# Patient Record
Sex: Female | Born: 1970 | Race: Black or African American | Hispanic: No | Marital: Single | Smoking: Never smoker
Health system: Southern US, Community
[De-identification: ages and names within clinical notes are randomized; demographics above are authoritative.]

## PROBLEM LIST (undated history)

## (undated) ENCOUNTER — Inpatient Hospital Stay (HOSPITAL_COMMUNITY): Payer: Self-pay | Admitting: Obstetrics & Gynecology

## (undated) DIAGNOSIS — I10 Essential (primary) hypertension: Secondary | ICD-10-CM

## (undated) DIAGNOSIS — R51 Headache: Secondary | ICD-10-CM

## (undated) DIAGNOSIS — F1010 Alcohol abuse, uncomplicated: Secondary | ICD-10-CM

## (undated) DIAGNOSIS — F329 Major depressive disorder, single episode, unspecified: Secondary | ICD-10-CM

## (undated) DIAGNOSIS — S0240FA Zygomatic fracture, left side, initial encounter for closed fracture: Secondary | ICD-10-CM

## (undated) DIAGNOSIS — K219 Gastro-esophageal reflux disease without esophagitis: Secondary | ICD-10-CM

## (undated) DIAGNOSIS — Z9851 Tubal ligation status: Secondary | ICD-10-CM

## (undated) DIAGNOSIS — Z9049 Acquired absence of other specified parts of digestive tract: Secondary | ICD-10-CM

## (undated) DIAGNOSIS — R569 Unspecified convulsions: Secondary | ICD-10-CM

## (undated) DIAGNOSIS — R519 Headache, unspecified: Secondary | ICD-10-CM

## (undated) DIAGNOSIS — F32A Depression, unspecified: Secondary | ICD-10-CM

## (undated) DIAGNOSIS — N83209 Unspecified ovarian cyst, unspecified side: Secondary | ICD-10-CM

## (undated) DIAGNOSIS — M1990 Unspecified osteoarthritis, unspecified site: Secondary | ICD-10-CM

## (undated) HISTORY — PX: TUBAL LIGATION: SHX77

## (undated) HISTORY — PX: APPENDECTOMY: SHX54

## (undated) HISTORY — DX: Unspecified ovarian cyst, unspecified side: N83.209

---

## 2001-05-16 ENCOUNTER — Emergency Department (HOSPITAL_COMMUNITY): Admission: EM | Admit: 2001-05-16 | Discharge: 2001-05-16 | Payer: Self-pay | Admitting: Emergency Medicine

## 2002-02-17 ENCOUNTER — Emergency Department (HOSPITAL_COMMUNITY): Admission: EM | Admit: 2002-02-17 | Discharge: 2002-02-17 | Payer: Self-pay | Admitting: Emergency Medicine

## 2002-05-24 ENCOUNTER — Inpatient Hospital Stay (HOSPITAL_COMMUNITY): Admission: AD | Admit: 2002-05-24 | Discharge: 2002-05-24 | Payer: Self-pay | Admitting: Obstetrics and Gynecology

## 2002-09-21 ENCOUNTER — Emergency Department (HOSPITAL_COMMUNITY): Admission: EM | Admit: 2002-09-21 | Discharge: 2002-09-21 | Payer: Self-pay | Admitting: Emergency Medicine

## 2002-11-28 ENCOUNTER — Emergency Department (HOSPITAL_COMMUNITY): Admission: EM | Admit: 2002-11-28 | Discharge: 2002-11-28 | Payer: Self-pay | Admitting: Emergency Medicine

## 2002-12-30 ENCOUNTER — Emergency Department (HOSPITAL_COMMUNITY): Admission: EM | Admit: 2002-12-30 | Discharge: 2002-12-30 | Payer: Self-pay | Admitting: Emergency Medicine

## 2002-12-31 ENCOUNTER — Inpatient Hospital Stay (HOSPITAL_COMMUNITY): Admission: AD | Admit: 2002-12-31 | Discharge: 2002-12-31 | Payer: Self-pay | Admitting: Family Medicine

## 2003-02-15 ENCOUNTER — Encounter: Admission: RE | Admit: 2003-02-15 | Discharge: 2003-02-15 | Payer: Self-pay | Admitting: Obstetrics and Gynecology

## 2003-02-24 ENCOUNTER — Inpatient Hospital Stay (HOSPITAL_COMMUNITY): Admission: EM | Admit: 2003-02-24 | Discharge: 2003-02-26 | Payer: Self-pay | Admitting: Emergency Medicine

## 2003-02-25 ENCOUNTER — Encounter (INDEPENDENT_AMBULATORY_CARE_PROVIDER_SITE_OTHER): Payer: Self-pay | Admitting: *Deleted

## 2003-03-06 ENCOUNTER — Emergency Department (HOSPITAL_COMMUNITY): Admission: EM | Admit: 2003-03-06 | Discharge: 2003-03-06 | Payer: Self-pay | Admitting: Emergency Medicine

## 2003-12-01 ENCOUNTER — Ambulatory Visit: Payer: Self-pay | Admitting: Family Medicine

## 2004-01-29 ENCOUNTER — Emergency Department (HOSPITAL_COMMUNITY): Admission: EM | Admit: 2004-01-29 | Discharge: 2004-01-29 | Payer: Self-pay | Admitting: Family Medicine

## 2004-08-03 ENCOUNTER — Inpatient Hospital Stay (HOSPITAL_COMMUNITY): Admission: AD | Admit: 2004-08-03 | Discharge: 2004-08-03 | Payer: Self-pay | Admitting: Obstetrics and Gynecology

## 2004-08-23 ENCOUNTER — Emergency Department (HOSPITAL_COMMUNITY): Admission: EM | Admit: 2004-08-23 | Discharge: 2004-08-23 | Payer: Self-pay | Admitting: Emergency Medicine

## 2005-11-20 ENCOUNTER — Inpatient Hospital Stay (HOSPITAL_COMMUNITY): Admission: AD | Admit: 2005-11-20 | Discharge: 2005-11-20 | Payer: Self-pay | Admitting: Obstetrics & Gynecology

## 2006-05-22 ENCOUNTER — Inpatient Hospital Stay (HOSPITAL_COMMUNITY): Admission: AD | Admit: 2006-05-22 | Discharge: 2006-05-23 | Payer: Self-pay | Admitting: Family Medicine

## 2007-10-13 ENCOUNTER — Emergency Department (HOSPITAL_COMMUNITY): Admission: EM | Admit: 2007-10-13 | Discharge: 2007-10-13 | Payer: Self-pay | Admitting: Emergency Medicine

## 2008-12-30 ENCOUNTER — Emergency Department (HOSPITAL_COMMUNITY): Admission: EM | Admit: 2008-12-30 | Discharge: 2008-12-30 | Payer: Self-pay | Admitting: Emergency Medicine

## 2009-06-05 ENCOUNTER — Inpatient Hospital Stay (HOSPITAL_COMMUNITY): Admission: AD | Admit: 2009-06-05 | Discharge: 2009-06-05 | Payer: Self-pay | Admitting: Family Medicine

## 2010-01-15 ENCOUNTER — Emergency Department (HOSPITAL_COMMUNITY)
Admission: EM | Admit: 2010-01-15 | Discharge: 2010-01-15 | Payer: Self-pay | Source: Home / Self Care | Admitting: Emergency Medicine

## 2010-03-22 ENCOUNTER — Other Ambulatory Visit (HOSPITAL_COMMUNITY)
Admission: RE | Admit: 2010-03-22 | Discharge: 2010-03-22 | Disposition: A | Discharge status: Home / Self Care | Payer: Self-pay | Source: Ambulatory Visit | Attending: Family Medicine | Admitting: Family Medicine

## 2010-03-22 ENCOUNTER — Other Ambulatory Visit: Payer: Self-pay | Admitting: Family Medicine

## 2010-03-22 DIAGNOSIS — Z1159 Encounter for screening for other viral diseases: Secondary | ICD-10-CM | POA: Insufficient documentation

## 2010-03-22 DIAGNOSIS — Z113 Encounter for screening for infections with a predominantly sexual mode of transmission: Secondary | ICD-10-CM | POA: Insufficient documentation

## 2010-03-22 DIAGNOSIS — Z124 Encounter for screening for malignant neoplasm of cervix: Secondary | ICD-10-CM | POA: Insufficient documentation

## 2010-04-23 LAB — RAPID STREP SCREEN (MED CTR MEBANE ONLY): Streptococcus, Group A Screen (Direct): NEGATIVE

## 2010-05-01 LAB — CBC
HCT: 35 % — ABNORMAL LOW (ref 36.0–46.0)
Hemoglobin: 11.7 g/dL — ABNORMAL LOW (ref 12.0–15.0)
MCHC: 33.4 g/dL (ref 30.0–36.0)
MCV: 88.1 fL (ref 78.0–100.0)
Platelets: 199 10*3/uL (ref 150–400)
RBC: 3.97 MIL/uL (ref 3.87–5.11)
RDW: 13.2 % (ref 11.5–15.5)
WBC: 5.6 10*3/uL (ref 4.0–10.5)

## 2010-05-01 LAB — URINALYSIS, ROUTINE W REFLEX MICROSCOPIC
Bilirubin Urine: NEGATIVE
Glucose, UA: NEGATIVE mg/dL
Hgb urine dipstick: NEGATIVE
Ketones, ur: NEGATIVE mg/dL
Nitrite: NEGATIVE
Protein, ur: NEGATIVE mg/dL
Specific Gravity, Urine: >1.03 — ABNORMAL HIGH (ref 1.005–1.030)
Urobilinogen, UA: 0.2 mg/dL (ref 0.0–1.0)
pH: 6 (ref 5.0–8.0)

## 2010-05-01 LAB — WET PREP, GENITAL
Trich, Wet Prep: NONE SEEN
Yeast Wet Prep HPF POC: NONE SEEN

## 2010-05-01 LAB — GC/CHLAMYDIA PROBE AMP, GENITAL
Chlamydia, DNA Probe: NEGATIVE
GC Probe Amp, Genital: NEGATIVE

## 2010-05-01 LAB — POCT PREGNANCY, URINE: Preg Test, Ur: NEGATIVE

## 2010-06-29 NOTE — Consult Note (Signed)
NAMEELIANY, Anita Perkins                        ACCOUNT NO.:  [id number]   MEDICAL RECORD NO.:  [id number]                   PATIENT TYPE:  INP   LOCATION:  6706                                 FACILITY:  [facility]   PHYSICIAN:  Gabrielle Dare. Janee Morn, M.D.             DATE OF BIRTH:  04/10/1970   DATE OF CONSULTATION:  02/25/2003  DATE OF DISCHARGE:                                   CONSULTATION   REASON FOR CONSULTATION:  Acute appendicitis.   HISTORY OF PRESENT ILLNESS:  The patient is a 40 year old African-American  female who had been previously healthy.  She was admitted yesterday by the  teaching service with generalized abdominal pain that has gradually  localized to the right lower quadrant.  Her workup subsequently included CT  scan of the abdomen and pelvis.  This returned consistent with acute  appendicitis.  The patient is complaining of worsening right lower quadrant  abdominal pain and inability to find a good position.  She also has some  mild nausea.   PAST MEDICAL HISTORY:  Negative.   PAST SURGICAL HISTORY:  Tubal ligation nine years ago.   SOCIAL HISTORY:  She does not smoke.  She drinks alcohol rarely.  She worked  as a Advertising copywriter for [employer].  She has four children and lives with  her sister.   FAMILY HISTORY:  Her mother is alive and well, but she has no knowledge of  her father's medical history.   REVIEW OF SYSTEMS:  GENERAL:  She feels tired and ill.  HEENT:  Negative.  CARDIOVASCULAR:  Negative.  RESPIRATORY:  No shortness of breath and no  cough.  GI:  She has nausea and vomiting associated with this pain.  GU:  Negative.  MUSCULOSKELETAL:  Negative.  The remainder of the Review of  Systems is negative.   PHYSICAL EXAMINATION:  VITAL SIGNS:  Temperature 98.2, blood pressure  135/89, pulse 82, respirations 18.  GENERAL:  She is awake, alert,and probably in mild distress.  HEENT:  Pupils equal and reactive. Sclerae nonicteric.  NECK:  Supple with  no masses.  LUNGS:  Clear to auscultation bilaterally.  HEART:  Regular rate and rhythm with no murmur.  PMI is palpable in the left  chest.  Distal pulses are 2+ and equal bilaterally.  ABDOMEN:  Soft. She has tenderness in the right lower quadrant with  guarding.  She also has a positive Rovsing's sign.  EXTREMITIES:  No edema.  SKIN:  Warm and dry with no rashes.   LABORATORY DATA:  Data reviewed includes white blood cell count of 14.9.   CT scan shows acute appendicitis.   IMPRESSION AND PLAN:  Acute appendicitis in otherwise healthy female.   PLAN:  Take her emergently to the operating room for laparoscopic  appendectomy.  Procedure, risks, and benefits were discussed in detail with  the patient, and she is agreeable to proceeding.  We will give her  intravenously antibiotics.                                               Gabrielle Dare Janee Morn, M.D.    BET/MEDQ  D:  02/25/2003  T:  02/25/2003  Job:  [id number]

## 2010-06-29 NOTE — Discharge Summary (Signed)
Anita, Perkins                        ACCOUNT NO.:  [id number]   MEDICAL RECORD NO.:  [id number]                   PATIENT TYPE:  INP   LOCATION:  6706                                 FACILITY:  [facility]   PHYSICIAN:  Adonis Housekeeper, M.D.                   DATE OF BIRTH:  Nov 25, 1970   DATE OF ADMISSION:  02/24/2003  DATE OF DISCHARGE:  02/26/2003                                 DISCHARGE SUMMARY   DISCHARGE DIAGNOSIS:  Acute appendicitis.   DISCHARGE MEDICATIONS:  Percocet 5/325 1 to  2 tablets q.6h. p.r.n. pain.   PROCEDURES:  1. A CT scan of the abdomen and pelvis performed February 24, 2003. Findings,     enlarged appendix with appendicolith at the base and periappendiceal     inflammatory changes with a small  amount of free fluid, consistent with     appendicitis.  2. Laparoscopic appendectomy performed on February 25, 2003, surgeon Gabrielle Dare.     Janee Morn, M.D. Preoperative and postoperative diagnosis  acute     appendicitis. The appendix was gangrenous but not performed.   CONSULTS:  Gabrielle Dare. Janee Morn, M.D., general surgery.   HISTORY OF PRESENT ILLNESS:  Please see admitting history and physical for  full details.   CHIEF COMPLAINT:  Severe nausea and vomiting.   HISTORY OF PRESENT ILLNESS:  Anita Perkins is  a 40 year old woman with no  significant past medical history, who presented to the emergency room with a  10 hour history of severe nausea and vomiting and abdominal pain. These  symptoms began abruptly around 1 a.m. Her last meal was at [employer]  around 10 p.m. She vomited multiple times this morning. The pain is focused  in the epigastric area and extends to  both sides. It feels better  when she  sits up. She had a normal bowel movement this morning. She denies diarrhea  and  fever. She feels hungry at the time of this examination. No known  sick  contacts. No dysuria. She  denies alcohol  use in the past week.   ALLERGIES:  PENICILLIN.   PAST MEDICAL  HISTORY:  She is status post tubal ligation 9 years ago.   MEDICATIONS:  None.   SOCIAL HISTORY:  She has never smoked, occasional alcohol, no cocaine or IV  drug use. She is separated. 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 She has 4 children, ages 55, 32, 34 and 4.   FAMILY HISTORY:  Her mother is in her 71s and healthy. Her father is  unknown. Children are healthy. She had a grandmother with diabetes and a  history of stomach  cancer and liver cancer in her grandparents.   REVIEW OF SYSTEMS:  Positive for weight gain recently secondary to decreased  exercise. Chest pain associated with vomiting. Other review of systems is  negative.   PHYSICAL EXAMINATION:  VITAL SIGNS:  Pulse 91, blood pressure 135/89,  temperature 98.2, respiratory rate 22.  GENERAL:  She is weak but in no acute distress.  HEENT:  Pupils are equal, round and reactive to light. Extraocular movements  intact. Oropharynx is clear.  NECK:  Supple without lymphadenopathy.  RESPIRATORY:  Clear to auscultation bilaterally.  CARDIOVASCULAR:  Regular rate and rhythm, no murmur, rub or gallop.  ABDOMEN:  Soft, belly tender  in the epigastric  area. No rebound or  guarding. Positive bowel sounds.  EXTREMITIES:  No edema.  GU:  External hemorrhoids. Normal tone. Guaiac is pending.  SKIN:  No rash.  LYMPH:  No lymphadenopathy.  NEUROLOGIC:  Grossly  intact.  PSYCH:  She is alert and oriented. She seems weak but  her affect is  appropriate.   LABORATORY DATA:  On admission, sodium 138, potassium 3.8, chloride 105, CO2  25, BUN 20, creatinine 1.0, glucose 120, calcium 9.5. White blood cell count  14.9, hemoglobin 14.1, platelets 215. ANC 13.4, MCV 88. Bilirubin 0.7,  alkaline phosphatase 44, SGOT 18, SGPT 13, protein 7.4, albumin 4.2. Lipase  23, amylase 150. Urinalysis negative for nitrites, negative for leukocytes.  Abdominal ultrasound negative with a normal pancreas. Fecal occult blood  testing is  negative. Urine pregnancy test is negative.   ASSESSMENT:  Abdominal pain, nausea and vomiting. At this time Anita Perkins  seems to likely be suffering from acute mild gastroenteritis such as [place]  virus. Food poisoning is felt to be possible, but this is impossible to  prove with  only a single case and no food source to  test. Her alcohol  intake may be greater than first admitted, but  with  the normal ultrasound,  lipase and hunger pancreatitis, it is relatively unlikely. Her pain is  severe  but  nonlocalizing. The suspicion of appendicitis is low, but we  will of course continue to follow this.   PLAN:  1. Admit to a regular bed.  2. Pain medication as needed and Phenergan.  3. Strict n.p.o. for now.   HOSPITAL COURSE:  PROBLEM #1, APPENDICITIS:  On admission Anita Perkins was  felt to be suffering  from viral gastroenteritis, however, during her stay,  her condition worsened and her abdominal examination  became more focal. On  the night  of February 24, 2003, she showed periumbilical pain with rebound  tenderness  in the right lower quadrant. Because of this she was sent to CT  for a scan of the abdomen and pelvis.   At this time the scan showed acute appendicitis, and she was taken to the  operating room by Dr. Violeta Gelinas for a laparoscopic appendectomy. Please  see his operative note for further details of the surgery. She tolerated  the procedure well and returned to the floor in good condition. Her pain,  nausea and vomiting were all improved  after the surgery. She continued to  improve. Her appetite returned and she was discharged to home.   She was prescribed Percocet  for pain management  and instructed to call for  an appointment with Dr. Janee Morn in 3 weeks at phone number [phone].  Discharge labs are pending.                                                Adonis Housekeeper, M.D.   TS/MEDQ  D:  02/26/2003  T:  02/26/2003  Job:  [id number]   cc:   Gabrielle Dare. Janee Morn,  M.D.  [facility]  [address] [place], [place] [address]  Fax: [phone]

## 2010-06-29 NOTE — Discharge Summary (Signed)
NAMELEISA, GAULT                        ACCOUNT NO.:  [id number]   MEDICAL RECORD NO.:  [id number]                   PATIENT TYPE:  INP   LOCATION:  6706                                 FACILITY:  [facility]   PHYSICIAN:  Adonis Housekeeper, M.D.                   DATE OF BIRTH:  1971-01-10   DATE OF ADMISSION:  02/24/2003  DATE OF DISCHARGE:  02/26/2003                                 DISCHARGE SUMMARY   ADDENDUM:   LABORATORY DATA:  White blood cell count 8.8, hemoglobin 11.4, hematocrit  34.1, platelet count 175.   FOLLOW UP:  Patient was instructed to follow up with Dr. Janee Morn.  She will  call the clinic for an appointment in 3 weeks.  She was also given the  number of the internal medicine resident outpatient clinic to follow up with  Dr. Adonis Housekeeper.                                                Adonis Housekeeper, M.D.    TS/MEDQ  D:  03/01/2003  T:  03/02/2003  Job:  [id number]

## 2010-06-29 NOTE — Group Therapy Note (Signed)
NAME:  Anita Perkins, Anita Perkins                        ACCOUNT NO.:  [id number]   MEDICAL RECORD NO.:  [id number]                   PATIENT TYPE:  OUT   LOCATION:  [facility]                           FACILITY:  [facility]   PHYSICIAN:  Dante Gang                     DATE OF BIRTH:  1970-08-22   DATE OF SERVICE:  02/15/2003                                    CLINIC NOTE   REASON FOR VISIT:  Routine GYN care and follow up from bacterial vaginosis.   SUBJECTIVE:  Anita Perkins is a 40 year old woman with no significant past  medical history who is seen for the first time in this hospital at the  emergency room one month ago for symptoms of irregular bleeding and vaginal  discharge.  At that time she was diagnosed with bacterial vaginosis and  given a course of Flagyl.  She says since then these symptoms have resolved  completely.  She was told to follow up here for routine GYN care including a  Pap smear.  She had her last Pap smear probably 2 years ago and was pretty  good about annual exams up until that point.  She says that she has never  had an abnormal Pap smear.   She has no other complaints at this time.   PAST MEDICAL HISTORY:  None of significance.   PAST GYNECOLOGICAL HISTORY:  She is a G 4, P 4 with four healthy children  ages 71, 49, 67, and 20.  There were no complications at any of these  pregnancies.   MEDICATIONS:  None currently.   REVIEW OF SYSTEMS:  No other significant complaints.   OBJECTIVE:  VITAL SIGNS:  Pulse is 88, blood pressure 147/76, weight is  171.8.  GENERAL:  This is a healthy-appearing 40 year old woman.  PELVIC:  Reveals normal external genitalia with no discharge.  Speculum exam  and bimanual exam reveal no abnormalities.  Of note, she does have  significant hemorrhoids.   ASSESSMENT AND PLAN:  1. Routine gynecological care.  Pap smear was performed today and she will     be informed of the results.  2. Status post bacterial vaginosis.  She will  return to clinic if the     symptoms return but for now she is doing well.  3. Hemorrhoids.  These are only of mild annoyance at this point and I     instructed her in treating them with over-the-counter creams.  If these     become more problematic, she should consider a surgical referral for     them.                                               Dante Gang    JP/MEDQ  D:  02/15/2003  T:  02/15/2003  Job:  [id number]

## 2010-06-29 NOTE — Op Note (Signed)
Anita Perkins, Anita Perkins                        ACCOUNT NO.:  [id number]   MEDICAL RECORD NO.:  [id number]                   PATIENT TYPE:  INP   LOCATION:  6706                                 FACILITY:  [facility]   PHYSICIAN:  Gabrielle Dare. Janee Morn, M.D.             DATE OF BIRTH:  1970/09/01   DATE OF PROCEDURE:  02/25/2003  DATE OF DISCHARGE:                                 OPERATIVE REPORT   PREOPERATIVE DIAGNOSIS:  Acute appendicitis.   POSTOPERATIVE DIAGNOSIS:  Acute appendicitis.   PROCEDURE:  Laparoscopic appendectomy.   SURGEON:  Gabrielle Dare. Janee Morn, M.D.   ANESTHESIA:  General.   HISTORY OF PRESENT ILLNESS:  The patient is a 40 year old African American  female  who was admitted with generalized abdominal pain yesterday to the  teaching service. The pain gradually  localized to her right lower quadrant.  She underwent  a CT scan of the abdomen and pelvis which was consistent with  acute appendicitis. She is brought to the operating room emergently for a  laparoscopic appendectomy.   DESCRIPTION OF PROCEDURE:  Informed consent was obtained. The patient  received intravenous antibiotics and she was brought to the operating room.  General anesthesia was administered. Her abdomen  was prepped and draped in  a sterile fashion.   A curvilinear infraumbilical incision was made. The subcutaneous tissues  were dissected down revealing the anterior fascia which was divided sharply.  The peritoneal cavity was then entered under direct vision without  difficulty. A 0 Vicryl pursestring suture  was placed around the fascial  opening and the Hasson trocar was inserted into the abdomen. The abdomen was  then inflated with CO2 in a standard fashion.   Under direct vision, a 12-mm left lower quadrant and a 5-mm right upper  quadrant port were placed. The abdomen was explored, revealing  a small  amount of cloudy fluid down in the pelvis. The appendix was clearly inflamed  and wrapped with a  bit of omentum. The omentum was dissected off bluntly.  The appendix appeared gangrenous in places but without frank perforation.  The base was nice and viable.   A window was made around the base. Once this was adequately dissected, the  base was divided with an endoscopic GIA stapler with a tissue load.  Subsequently the mesoappendix was taken down with 2 separate firings of the  endoscopic GIA with a vascular load.   This completed removal of the appendix. It was placed in an EndoCatch bag  and taken out of the abdomen via the left lower quadrant port site.  Subsequently the abdomen was copiously irrigated. The mesoappendix remained  hemostatic. The staple line on the cecum looked  nicely intact. We irrigated  the abdomen with 2 liters of saline until the irrigant returned completely  clear. There was no significant purulence. The area was checked again for  hemostasis.   The patient was flattened down. The remainder  of the irrigation was  suctioned out which was clear. The ports were removed under direct vision.  The pneumoperitoneum was released and the Hasson trocar was removed. The  umbilical fascial defect was closed by tying the 0 Vicryl pursestring  suture. All 3 wounds were copiously irrigated. Then 0.25% Marcaine with  epinephrine was used for postoperative pain control and the skin of each was  closed with a running 4-0 Vicryl subcuticular stitch.   All sponge, instrument and needle counts were correct. Benzoin and Steri-  Strips and sterile dressings were applied. The patient tolerated the  procedure without apparent complications and was taken to the recovery room  in stable condition.                                               Gabrielle Dare Janee Morn, M.D.    BET/MEDQ  D:  02/25/2003  T:  02/25/2003  Job:  [id number]

## 2010-11-14 LAB — RAPID STREP SCREEN (MED CTR MEBANE ONLY): Streptococcus, Group A Screen (Direct): NEGATIVE

## 2012-02-07 ENCOUNTER — Emergency Department (HOSPITAL_COMMUNITY): Payer: Self-pay

## 2012-02-07 ENCOUNTER — Observation Stay (HOSPITAL_COMMUNITY)
Admission: EM | Admit: 2012-02-07 | Discharge: 2012-02-08 | Disposition: A | Discharge status: Home / Self Care | Payer: Self-pay | Attending: Obstetrics & Gynecology | Admitting: Obstetrics & Gynecology

## 2012-02-07 ENCOUNTER — Encounter (HOSPITAL_COMMUNITY): Payer: Self-pay

## 2012-02-07 DIAGNOSIS — I10 Essential (primary) hypertension: Secondary | ICD-10-CM | POA: Insufficient documentation

## 2012-02-07 DIAGNOSIS — K661 Hemoperitoneum: Secondary | ICD-10-CM

## 2012-02-07 DIAGNOSIS — N83209 Unspecified ovarian cyst, unspecified side: Principal | ICD-10-CM | POA: Insufficient documentation

## 2012-02-07 DIAGNOSIS — N949 Unspecified condition associated with female genital organs and menstrual cycle: Secondary | ICD-10-CM | POA: Insufficient documentation

## 2012-02-07 DIAGNOSIS — D62 Acute posthemorrhagic anemia: Secondary | ICD-10-CM | POA: Insufficient documentation

## 2012-02-07 DIAGNOSIS — R112 Nausea with vomiting, unspecified: Secondary | ICD-10-CM | POA: Insufficient documentation

## 2012-02-07 DIAGNOSIS — R109 Unspecified abdominal pain: Secondary | ICD-10-CM | POA: Insufficient documentation

## 2012-02-07 HISTORY — DX: Tubal ligation status: Z98.51

## 2012-02-07 HISTORY — DX: Essential (primary) hypertension: I10

## 2012-02-07 HISTORY — DX: Acquired absence of other specified parts of digestive tract: Z90.49

## 2012-02-07 LAB — URINALYSIS, ROUTINE W REFLEX MICROSCOPIC
Bilirubin Urine: NEGATIVE
Glucose, UA: NEGATIVE mg/dL
Hgb urine dipstick: NEGATIVE
Ketones, ur: NEGATIVE mg/dL
Leukocytes, UA: NEGATIVE
Protein, ur: NEGATIVE mg/dL
Specific Gravity, Urine: 1.029 (ref 1.005–1.030)
Urobilinogen, UA: 0.2 mg/dL (ref 0.0–1.0)

## 2012-02-07 LAB — COMPREHENSIVE METABOLIC PANEL
ALT: 8 U/L (ref 0–35)
AST: 11 U/L (ref 0–37)
Albumin: 3.7 g/dL (ref 3.5–5.2)
BUN: 15 mg/dL (ref 6–23)
CO2: 23 mEq/L (ref 19–32)
Calcium: 9.3 mg/dL (ref 8.4–10.5)
Creatinine, Ser: 0.88 mg/dL (ref 0.50–1.10)
GFR calc Af Amer: >90 mL/min (ref >90)
GFR calc non Af Amer: 82 mL/min — ABNORMAL LOW (ref >90)
Glucose, Bld: 116 mg/dL — ABNORMAL HIGH (ref 70–99)
Potassium: 3.8 mEq/L (ref 3.5–5.1)
Total Protein: 6.8 g/dL (ref 6.0–8.3)

## 2012-02-07 LAB — CBC WITH DIFFERENTIAL/PLATELET
Basophils Relative: 0 % (ref 0–1)
Eosinophils Absolute: 0 10*3/uL (ref 0.0–0.7)
Eosinophils Relative: 0 % (ref 0–5)
Hemoglobin: 11 g/dL — ABNORMAL LOW (ref 12.0–15.0)
Lymphs Abs: 1.1 10*3/uL (ref 0.7–4.0)
MCH: 29.6 pg (ref 26.0–34.0)
MCHC: 33.7 g/dL (ref 30.0–36.0)
MCV: 87.6 fL (ref 78.0–100.0)
Monocytes Absolute: 0.4 10*3/uL (ref 0.1–1.0)
Monocytes Relative: 4 % (ref 3–12)
Platelets: 221 10*3/uL (ref 150–400)
RBC: 3.72 MIL/uL — ABNORMAL LOW (ref 3.87–5.11)
RDW: 12.9 % (ref 11.5–15.5)
WBC: 10.3 10*3/uL (ref 4.0–10.5)

## 2012-02-07 LAB — POCT PREGNANCY, URINE: Preg Test, Ur: NEGATIVE

## 2012-02-07 LAB — LIPASE, BLOOD: Lipase: 18 U/L (ref 11–59)

## 2012-02-07 LAB — WET PREP, GENITAL
Trich, Wet Prep: NONE SEEN
Yeast Wet Prep HPF POC: NONE SEEN

## 2012-02-07 MED ORDER — ONDANSETRON HCL 4 MG/2ML IJ SOLN
4.0000 mg | Freq: Once | INTRAMUSCULAR | Status: AC
  Administered 2012-02-07: 4 mg via INTRAVENOUS
  Filled 2012-02-07: qty 2

## 2012-02-07 MED ORDER — HYDROMORPHONE HCL PF 1 MG/ML IJ SOLN
1.0000 mg | Freq: Once | INTRAMUSCULAR | Status: AC
  Administered 2012-02-07: 1 mg via INTRAVENOUS
  Filled 2012-02-07: qty 1

## 2012-02-07 MED ORDER — HYDROMORPHONE HCL PF 1 MG/ML IJ SOLN
1.0000 mg | INTRAMUSCULAR | Status: DC | PRN
  Administered 2012-02-08: 1 mg via INTRAVENOUS
  Filled 2012-02-07 (×5): qty 1

## 2012-02-07 MED ORDER — MORPHINE SULFATE 4 MG/ML IJ SOLN
4.0000 mg | Freq: Once | INTRAMUSCULAR | Status: AC
  Administered 2012-02-07: 4 mg via INTRAVENOUS
  Filled 2012-02-07: qty 1

## 2012-02-07 MED ORDER — SODIUM CHLORIDE 0.9 % IV BOLUS (SEPSIS)
1000.0000 mL | Freq: Once | INTRAVENOUS | Status: AC
  Administered 2012-02-07: 1000 mL via INTRAVENOUS

## 2012-02-07 MED ORDER — IOHEXOL 300 MG/ML  SOLN
100.0000 mL | Freq: Once | INTRAMUSCULAR | Status: AC | PRN
  Administered 2012-02-07: 100 mL via INTRAVENOUS

## 2012-02-07 MED ORDER — SODIUM CHLORIDE 0.9 % IV SOLN
Freq: Once | INTRAVENOUS | Status: AC
  Administered 2012-02-07: 17:00:00 via INTRAVENOUS

## 2012-02-07 NOTE — ED Provider Notes (Signed)
History     CSN: [id number]  Arrival date & time 02/07/12  1352   First MD Initiated Contact with Patient 02/07/12 1505      Chief Complaint  Patient presents with  . Abdominal Pain  . Nausea  . Pelvic Pain    Patient is a 41 y.o. female presenting with abdominal pain. The history is provided by the patient.  Abdominal Pain The primary symptoms of the illness include nausea and diarrhea. The primary symptoms of the illness do not include fever, shortness of breath, vomiting, dysuria, vaginal discharge or vaginal bleeding. The current episode started 6 to 12 hours ago. The onset of the illness was sudden. The problem has been gradually worsening.  The patient has had a change in bowel habit. Symptoms associated with the illness do not include urgency, frequency or back pain.  pt reports she had otherwise been at her baseline health today and then had onset of lower pelvic pain that radiates into her upper abdomen.  She reports one episode of diarrhea.  No fever. No vomiting.  She has never had this before.   She reports pain is so severe it "takes my breath away" but no active CP/SOB reported Past Medical History  Diagnosis Date  . Hx of appendectomy   . H/O tubal ligation   . Hypertension     Past Surgical History  Procedure Date  . Appendectomy   . Tubal ligation     History reviewed. No pertinent family history.  History  Substance Use Topics  . Smoking status: Never Smoker   . Smokeless tobacco: Not on file  . Alcohol Use: Yes     Comment: 1-2 bottles wine/ every other day    OB History    Grav Para Term Preterm Abortions TAB SAB Ect Mult Living                  Review of Systems  Constitutional: Negative for fever.  Respiratory: Negative for shortness of breath.   Cardiovascular: Negative for chest pain.  Gastrointestinal: Positive for nausea and diarrhea. Negative for vomiting.  Genitourinary: Negative for dysuria, urgency, frequency, vaginal bleeding and  vaginal discharge.  Musculoskeletal: Negative for back pain.  Psychiatric/Behavioral: Negative for agitation.  All other systems reviewed and are negative.    Allergies  Penicillins  Home Medications  No current outpatient prescriptions on file.  BP 148/88  Pulse 72  Temp 98.6 F (37 C) (Oral)  Resp 18  SpO2 100%  LMP 12/20/2011  Physical Exam CONSTITUTIONAL: Well developed/well nourished HEAD AND FACE: Normocephalic/atraumatic EYES: EOMI/PERRL, no icterus ENMT: Mucous membranes moist NECK: supple no meningeal signs SPINE:entire spine nontender CV: S1/S2 noted, no murmurs/rubs/gallops noted LUNGS: Lungs are clear to auscultation bilaterally, no apparent distress ABDOMEN: soft, diffuse tenderness, tenderness is moderate, no rebound or guarding GU:no cva tenderness No cmt.  No uterine tenderness.  No vag bleeding/discharge.  No adnexal tenderness.  Chaperone present NEURO: Pt is awake/alert, moves all extremitiesx4 EXTREMITIES: pulses normal, full ROM SKIN: warm, color normal PSYCH: no abnormalities of mood noted  ED Course  Procedures CRITICAL CARE Performed by: Joya Gaskins   Total critical care time: 40  Critical care time was exclusive of separately billable procedures and treating other patients.  Critical care was necessary to treat or prevent imminent or life-threatening deterioration.  Critical care was time spent personally by me on the following activities: development of treatment plan with patient and/or surrogate as well as nursing, discussions with consultants, evaluation  of patient's response to treatment, examination of patient, obtaining history from patient or surrogate, ordering and performing treatments and interventions, ordering and review of laboratory studies, ordering and review of radiographic studies, pulse oximetry and re-evaluation of patient's condition.  Labs Reviewed  URINALYSIS, ROUTINE W REFLEX MICROSCOPIC - Abnormal; Notable  for the following:    APPearance CLOUDY (*)     All other components within normal limits  POCT PREGNANCY, URINE  CBC WITH DIFFERENTIAL  COMPREHENSIVE METABOLIC PANEL  LIPASE, BLOOD  GC/CHLAMYDIA PROBE AMP  WET PREP, GENITAL   3:26 PM Pt presents with acute onset of lower abd pain. She is tender on abdominal exam.  Will treat pain and reassess 4:50 PM Pt still with diffuse abdominal pain in all quadrants.  No significant uterine or adnexal tenderness.  Will obtain CT imaging of abdomen 8:01 PM D/w radiology concerning CT findings Will call gynecology 8:07 PM D/w dr legget with OBGYN [facility].  Will not need emergent operative repair   MDM  Nursing notes including past medical history and social history reviewed and considered in documentation Labs/vital reviewed and considered Previous records reviewed and considered - h/o appendicitis with appendectomy         Joya Gaskins, MD 02/07/12 2007

## 2012-02-07 NOTE — ED Notes (Signed)
Pt to CT at this time.

## 2012-02-07 NOTE — ED Notes (Signed)
Reports around 9:00am while @ work experienced abdomin pain rated 10/10 thought it may have been menstrual cramps (placed peripad on period is late which is normal for patient.) Left work an hour early due to the pain. While in triage had loose stool. Stated pain starts in stomach & goes to chest.

## 2012-02-07 NOTE — ED Notes (Addendum)
Starting this am, pt has been having lower abd pain/pelvic pain and nausea; "like something is going to fall out"   States her tubes are tied.  Denies urinary symptoms except frequency.  Pain increases w/ambulation and palpation.

## 2012-02-07 NOTE — ED Notes (Signed)
Per EMS:  Pt states that she is having lower abd pain that started around 0800 this morning.  Pt attributed this to menstrual cramping however she is "late for her period".  Pain 10/10.  Pt is not on birth control.  Last had sex first week of December.

## 2012-02-07 NOTE — ED Notes (Signed)
Oral contrast started.

## 2012-02-07 NOTE — ED Notes (Signed)
MD Wickline at bedside to update patient and family.

## 2012-02-07 NOTE — ED Notes (Signed)
[facility]

## 2012-02-07 NOTE — ED Notes (Signed)
Pt to be transferred to room 315 at [facility] hospital

## 2012-02-08 ENCOUNTER — Inpatient Hospital Stay (HOSPITAL_COMMUNITY): Payer: Self-pay

## 2012-02-08 DIAGNOSIS — D62 Acute posthemorrhagic anemia: Secondary | ICD-10-CM | POA: Diagnosis present

## 2012-02-08 DIAGNOSIS — I10 Essential (primary) hypertension: Secondary | ICD-10-CM | POA: Diagnosis present

## 2012-02-08 DIAGNOSIS — R109 Unspecified abdominal pain: Secondary | ICD-10-CM

## 2012-02-08 DIAGNOSIS — N83209 Unspecified ovarian cyst, unspecified side: Secondary | ICD-10-CM | POA: Diagnosis present

## 2012-02-08 LAB — CBC
HCT: 27.2 % — ABNORMAL LOW (ref 36.0–46.0)
MCH: 29.6 pg (ref 26.0–34.0)
MCHC: 32.8 g/dL (ref 30.0–36.0)
MCHC: 33.3 g/dL (ref 30.0–36.0)
MCHC: 33.5 g/dL (ref 30.0–36.0)
Platelets: 162 10*3/uL (ref 150–400)
Platelets: 185 10*3/uL (ref 150–400)
RBC: 2.94 MIL/uL — ABNORMAL LOW (ref 3.87–5.11)
RDW: 13.2 % (ref 11.5–15.5)
RDW: 13.2 % (ref 11.5–15.5)
WBC: 8.8 10*3/uL (ref 4.0–10.5)

## 2012-02-08 LAB — GC/CHLAMYDIA PROBE AMP
CT Probe RNA: NEGATIVE
GC Probe RNA: NEGATIVE

## 2012-02-08 LAB — HCG, QUANTITATIVE, PREGNANCY: hCG, Beta Chain, Quant, S: <1 m[IU]/mL (ref <5)

## 2012-02-08 LAB — ABO/RH: ABO/RH(D): O POS

## 2012-02-08 LAB — PREPARE RBC (CROSSMATCH)

## 2012-02-08 MED ORDER — HYDROMORPHONE HCL PF 1 MG/ML IJ SOLN
1.0000 mg | INTRAMUSCULAR | Status: DC | PRN
  Administered 2012-02-08 (×4): 1 mg via INTRAVENOUS

## 2012-02-08 MED ORDER — HYDROCHLOROTHIAZIDE 25 MG PO TABS: 25.0000 mg | ORAL_TABLET | Freq: Every day | ORAL | Status: DC

## 2012-02-08 MED ORDER — SODIUM CHLORIDE 0.9 % IV SOLN
INTRAVENOUS | Status: DC
  Administered 2012-02-08: 06:00:00 via INTRAVENOUS

## 2012-02-08 MED ORDER — HYDROCHLOROTHIAZIDE 25 MG PO TABS
25.0000 mg | ORAL_TABLET | Freq: Every day | ORAL | Status: DC
  Administered 2012-02-08: 25 mg via ORAL
  Filled 2012-02-08 (×2): qty 1

## 2012-02-08 MED ORDER — ONDANSETRON HCL 4 MG/2ML IJ SOLN
4.0000 mg | Freq: Four times a day (QID) | INTRAMUSCULAR | Status: DC | PRN
  Administered 2012-02-08: 4 mg via INTRAVENOUS
  Filled 2012-02-08: qty 2

## 2012-02-08 MED ORDER — OXYCODONE-ACETAMINOPHEN 5-325 MG PO TABS: 1.0000 | ORAL_TABLET | Freq: Four times a day (QID) | ORAL | Status: DC | PRN

## 2012-02-08 MED ORDER — ONDANSETRON HCL 4 MG PO TABS: 4.0000 mg | ORAL_TABLET | Freq: Four times a day (QID) | ORAL | Status: DC | PRN

## 2012-02-08 MED ORDER — OXYCODONE-ACETAMINOPHEN 5-325 MG PO TABS
1.0000 | ORAL_TABLET | Freq: Four times a day (QID) | ORAL | Status: DC | PRN
  Administered 2012-02-08 (×2): 1 via ORAL
  Filled 2012-02-08 (×3): qty 1

## 2012-02-08 NOTE — Discharge Summary (Signed)
Physician Discharge Summary  Patient ID: Anita Perkins MRN: [id number] DOB/AGE: Jun 14, 1970 41 y.o.  Admit date: 02/07/2012 Discharge date: 02/08/2012   Discharge Diagnoses:  Principal Problem:  *Ruptured ovarian cyst Active Problems:  Acute blood loss anemia  Hypertension   Consults: None  Significant Diagnostic Studies: labs:  CBC    Component Value Date/Time   WBC 8.8 02/08/2012 0550   RBC 2.94* 02/08/2012 0550   HGB 8.6* 02/08/2012 0550   HCT 26.2* 02/08/2012 0550   PLT 167 02/08/2012 0550   MCV 89.1 02/08/2012 0550   MCH 29.3 02/08/2012 0550   MCHC 32.8 02/08/2012 0550   RDW 13.2 02/08/2012 0550   LYMPHSABS 1.1 02/07/2012 1546   MONOABS 0.4 02/07/2012 1546   EOSABS 0.0 02/07/2012 1546   BASOSABS 0.0 02/07/2012 1546   Hgb 11.0->9.1->8.7->8.6  US Pelvis Complete  02/08/2012  *RADIOLOGY REPORT*  Clinical Data: Intraperitoneal blood noted on CT; abdominal pain.  TRANSABDOMINAL AND TRANSVAGINAL ULTRASOUND OF PELVIS Technique:  Both transabdominal and transvaginal ultrasound examinations of the pelvis were performed. Transabdominal technique was performed for global imaging of the pelvis including uterus, ovaries, adnexal regions, and pelvic cul-de-sac.  It was necessary to proceed with endovaginal exam following the transabdominal exam to visualize the uterus and ovaries in greater detail.  Comparison:  None  Findings:  Uterus: Normal in size and appearance; measures 10.2 x 5.0 x 6.9 cm.  Endometrium: Normal in thickness and appearance; measures 1.1 cm in thickness.  Right ovary:  Normal appearance/no adnexal mass; measures 3.4 x 1.7 x 1.7 cm.  Left ovary: Normal appearance/no adnexal mass; measures 3.6 x 2.5 x 2.9 cm.  Other findings: A large amount of free fluid is noted within the pelvis, somewhat complex in nature, with a large amount of clot noted at the left side of the pelvis, surrounding and extending above the left adnexa.  IMPRESSION: 1.  Uterus and ovaries grossly  unremarkable in appearance. 2.  Large amount of clot noted at the left side of the pelvis, surrounding and extending above the left adnexa, as seen on CT. Large amount of associated complex serosanguinous fluid seen. Given the location of the clot, this most likely reflects rupture of a left-sided hemorrhagic ovarian cyst, with associated unusually significant bleeding at the site of rupture.   Original Report Authenticated By: Tonia Ghent, M.D.    Ct Abdomen Pelvis W Contrast  02/07/2012  *RADIOLOGY REPORT*  Clinical Data: Acute onset of severe abdominal pain.  CT ABDOMEN AND PELVIS WITH CONTRAST  Technique:  Multidetector CT imaging of the abdomen and pelvis was performed following the standard protocol during bolus administration of intravenous contrast.  Contrast: OMNIPAQUE IOHEXOL 300 MG/ML  SOLN  Comparison: 02/25/2003  Findings: Lung bases are clear.  No pleural or pericardial fluid.  There is a large amount of intraperitoneal fluid which is hyperdense consistent with intraperitoneal hemorrhage.  This source is probably in the pelvis adjacent to and to the left of the uterus and therefore I suspect that this is of ovarian origin.  The liver parenchyma shows a 1.7 cm cyst in the lateral segment of the left lobe and a 4 mm cyst in the right lobe.  No calcified gallstones.  The spleen is normal.  The pancreas is normal.  The adrenal glands are normal.  The kidneys are normal except for a nonobstructing 3 mm stone in the lower pole the right kidney.  The aorta and IVC are normal.  No retroperitoneal mass or adenopathy. The uterus itself  does not appear abnormal.  The bladder is unremarkable.  No bony abnormality.  IMPRESSION: Large amount of intraperitoneal hemorrhage, probably originating in the pelvis to the left of the uterus.  Therefore, I suspect that the origin is ovarian.  Critical Value/emergent results were called by telephone at the time of interpretation on 02/07/2012 at 1940 hours to Dr.  Bebe Shaggy, who verbally acknowledged these results.   Original Report Authenticated By: Paulina Fusi, M.D.      Hospital Course:   Admitted with acute hemoperitoneum following ruptured ovarian cyst and dropping Hemoglobin.  Admitted for observation and serial hemoglobin assessments.  Blood pressure elevated and treated with HCTZ.  BP is still elevated but improved.  Pain is improved.  Hemoglobin is stable.  Tolerating PO. Ok for D/C   Disposition: Discharged to home  Discharged Condition: improved     Medication List     As of 02/08/2012  3:15 PM    TAKE these medications         hydrochlorothiazide 25 MG tablet   Commonly known as: HYDRODIURIL   Take 1 tablet (25 mg total) by mouth daily.      oxyCODONE-acetaminophen 5-325 MG per tablet   Commonly known as: PERCOCET/ROXICET   Take 1-2 tablets by mouth every 6 (six) hours as needed.           Follow-up Information    Follow up with [facility]. (They will call you with appointment)    Contact information:   [address] [place], [place] [phone]        Try to find PCP to follow her BP.  Signed: Dilyn Smiles S 02/08/2012, 3:15 PM

## 2012-02-08 NOTE — H&P (Signed)
Anita Perkins is an 41 y.o. female with PMH significant for chronic HTN (not currently taking medications secondary to cost) who presented to [facility] ED with complaint of abdominal pain since 9:30 AM; CT at [facility] and [place] at [facility] show free fluid in the peritoneum consistent with blood/clot presumably from left ovary/adnexa. She states the pain was sudden in onset and "brought her to her knees," and "felt like menstrual pain, like when I had appendicitis, like I needed to have a BM, and like I was needing to push a baby out, all at once." The pain is currently described as sharp on the right side, worse than the left which is more dull. She has had only moderate relief with Dilaudid both here and at [facility]. She has had nausea and had good relief from Zofran. She is somewhat puzzled that her pain is worse on the right while her imaging findings are on the left.  Otherwise, she has few complaints. Denies vaginal bleeding; states her last menses was in November. She denies fever/chills, vomiting, chest pain or SOB. She does endorse one loose BM at [facility]. No dysuria or burning on urination. No discharge.  Pertinent Gynecological History: Menses: Last menses 11/8; states she is "2-3 weeks late" for December Bleeding: denies current bleeding Contraception: s/p tubal ligation Sexually transmitted diseases: no past history Previous GYN Procedures: none  OB History: G4, P4   Menstrual History: Patient's last menstrual period was 12/20/2011.    Past Medical History  Diagnosis Date  . Hx of appendectomy   . H/O tubal ligation   . Hypertension     Past Surgical History  Procedure Date  . Appendectomy   . Tubal ligation     History reviewed. No pertinent family history.  Social History:  reports that she has never smoked. She does not have any smokeless tobacco history on file. She reports that she drinks alcohol. She reports that she uses illicit drugs (Marijuana).  Allergies:  Allergies  Allergen  Reactions  . Penicillins Other (See Comments)    Childhood allergy    No prescriptions prior to admission    ROS: See HPI  Blood pressure 142/97, pulse 84, temperature 98.1 F (36.7 C), temperature source Oral, resp. rate 18, height 5' 6.5" (1.689 m), weight 85.276 kg (188 lb), last menstrual period 12/20/2011, SpO2 99.00%. Physical Exam  Vitals reviewed. Constitutional: She is oriented to person, place, and time. She appears well-developed and well-nourished. No distress (in obvious pain with moving and portions of exam, but not actively distressed).  HENT:  Head: Normocephalic and atraumatic.  Eyes: Conjunctivae normal are normal. Pupils are equal, round, and reactive to light.  Neck: Normal range of motion. Neck supple.  Cardiovascular: Normal rate, regular rhythm, normal heart sounds and intact distal pulses.   No murmur heard. Respiratory: Effort normal and breath sounds normal. She has no wheezes.  GI: Soft. She exhibits no distension and no mass. There is tenderness.       Bowel sounds faint. Tenderness generalized; specific tenderness in epigastrium and bilateral upper quadrants, significantly worse on right.  Musculoskeletal: Normal range of motion. She exhibits no tenderness (no paraspinal/lumbar tenderness, no CVA tenderness).  Neurological: She is alert and oriented to person, place, and time.  Skin: Skin is warm and dry. No erythema.  Psychiatric: She has a normal mood and affect. Her behavior is normal. Thought content normal.    Results for orders placed during the hospital encounter of 02/07/12 (from the past 24 hour(s))  URINALYSIS, ROUTINE W REFLEX MICROSCOPIC     Status: Abnormal   Collection Time   02/07/12  2:53 PM      Component Value Range   Color, Urine YELLOW  YELLOW   APPearance CLOUDY (*) CLEAR   Specific Gravity, Urine 1.029  1.005 - 1.030   pH 5.5  5.0 - 8.0   Glucose, UA NEGATIVE  NEGATIVE mg/dL   Hgb urine dipstick NEGATIVE  NEGATIVE   Bilirubin  Urine NEGATIVE  NEGATIVE   Ketones, ur NEGATIVE  NEGATIVE mg/dL   Protein, ur NEGATIVE  NEGATIVE mg/dL   Urobilinogen, UA 0.2  0.0 - 1.0 mg/dL   Nitrite NEGATIVE  NEGATIVE   Leukocytes, UA NEGATIVE  NEGATIVE  POCT PREGNANCY, URINE     Status: Normal   Collection Time   02/07/12  3:18 PM      Component Value Range   Preg Test, Ur NEGATIVE  NEGATIVE  CBC WITH DIFFERENTIAL     Status: Abnormal   Collection Time   02/07/12  3:46 PM      Component Value Range   WBC 10.3  4.0 - 10.5 K/uL   RBC 3.72 (*) 3.87 - 5.11 MIL/uL   Hemoglobin 11.0 (*) 12.0 - 15.0 g/dL   HCT 40.9 (*) 81.1 - 91.4 %   MCV 87.6  78.0 - 100.0 fL   MCH 29.6  26.0 - 34.0 pg   MCHC 33.7  30.0 - 36.0 g/dL   RDW 78.2  95.6 - 21.3 %   Platelets 221  150 - 400 K/uL   Neutrophils Relative 85 (*) 43 - 77 %   Neutro Abs 8.8 (*) 1.7 - 7.7 K/uL   Lymphocytes Relative 11 (*) 12 - 46 %   Lymphs Abs 1.1  0.7 - 4.0 K/uL   Monocytes Relative 4  3 - 12 %   Monocytes Absolute 0.4  0.1 - 1.0 K/uL   Eosinophils Relative 0  0 - 5 %   Eosinophils Absolute 0.0  0.0 - 0.7 K/uL   Basophils Relative 0  0 - 1 %   Basophils Absolute 0.0  0.0 - 0.1 K/uL  COMPREHENSIVE METABOLIC PANEL     Status: Abnormal   Collection Time   02/07/12  3:46 PM      Component Value Range   Sodium 137  135 - 145 mEq/L   Potassium 3.8  3.5 - 5.1 mEq/L   Chloride 107  96 - 112 mEq/L   CO2 23  19 - 32 mEq/L   Glucose, Bld 116 (*) 70 - 99 mg/dL   BUN 15  6 - 23 mg/dL   Creatinine, Ser 0.86  0.50 - 1.10 mg/dL   Calcium 9.3  8.4 - 57.8 mg/dL   Total Protein 6.8  6.0 - 8.3 g/dL   Albumin 3.7  3.5 - 5.2 g/dL   AST 11  0 - 37 U/L   ALT 8  0 - 35 U/L   Alkaline Phosphatase 36 (*) 39 - 117 U/L   Total Bilirubin 0.4  0.3 - 1.2 mg/dL   GFR calc non Af Amer 82 (*) >90 mL/min   GFR calc Af Amer >90  >90 mL/min  LIPASE, BLOOD     Status: Normal   Collection Time   02/07/12  3:46 PM      Component Value Range   Lipase 18  11 - 59 U/L  WET PREP, GENITAL      Status: Abnormal   Collection Time  02/07/12  4:46 PM      Component Value Range   Yeast Wet Prep HPF POC NONE SEEN  NONE SEEN   Trich, Wet Prep NONE SEEN  NONE SEEN   Clue Cells Wet Prep HPF POC MANY (*) NONE SEEN   WBC, Wet Prep HPF POC MANY (*) NONE SEEN  CBC     Status: Abnormal   Collection Time   02/08/12 12:11 AM      Component Value Range   WBC 10.5  4.0 - 10.5 K/uL   RBC 3.06 (*) 3.87 - 5.11 MIL/uL   Hemoglobin 9.1 (*) 12.0 - 15.0 g/dL   HCT 16.1 (*) 09.6 - 04.5 %   MCV 88.9  78.0 - 100.0 fL   MCH 29.7  26.0 - 34.0 pg   MCHC 33.5  30.0 - 36.0 g/dL   RDW 40.9  81.1 - 91.4 %   Platelets 185  150 - 400 K/uL    US Transvaginal Non-ob/ US Pelvis Complete  02/08/2012  *RADIOLOGY REPORT*  Clinical Data: Intraperitoneal blood noted on CT; abdominal pain.  TRANSABDOMINAL AND TRANSVAGINAL ULTRASOUND OF PELVIS Technique:  Both transabdominal and transvaginal ultrasound examinations of the pelvis were performed. Transabdominal technique was performed for global imaging of the pelvis including uterus, ovaries, adnexal regions, and pelvic cul-de-sac.  It was necessary to proceed with endovaginal exam following the transabdominal exam to visualize the uterus and ovaries in greater detail.  Comparison:  None  Findings:  Uterus: Normal in size and appearance; measures 10.2 x 5.0 x 6.9 cm.  Endometrium: Normal in thickness and appearance; measures 1.1 cm in thickness.  Right ovary:  Normal appearance/no adnexal mass; measures 3.4 x 1.7 x 1.7 cm.  Left ovary: Normal appearance/no adnexal mass; measures 3.6 x 2.5 x 2.9 cm.  Other findings: A large amount of free fluid is noted within the pelvis, somewhat complex in nature, with a large amount of clot noted at the left side of the pelvis, surrounding and extending above the left adnexa.  IMPRESSION: 1.  Uterus and ovaries grossly unremarkable in appearance. 2.  Large amount of clot noted at the left side of the pelvis, surrounding and extending above  the left adnexa, as seen on CT. Large amount of associated complex serosanguinous fluid seen. Given the location of the clot, this most likely reflects rupture of a left-sided hemorrhagic ovarian cyst, with associated unusually significant bleeding at the site of rupture.   Original Report Authenticated By: Tonia Ghent, M.D.    Ct Abdomen Pelvis W Contrast  02/07/2012  *RADIOLOGY REPORT*  Clinical Data: Acute onset of severe abdominal pain.  CT ABDOMEN AND PELVIS WITH CONTRAST  Technique:  Multidetector CT imaging of the abdomen and pelvis was performed following the standard protocol during bolus administration of intravenous contrast.  Contrast: OMNIPAQUE IOHEXOL 300 MG/ML  SOLN  Comparison: 02/25/2003  Findings: Lung bases are clear.  No pleural or pericardial fluid.  There is a large amount of intraperitoneal fluid which is hyperdense consistent with intraperitoneal hemorrhage.  This source is probably in the pelvis adjacent to and to the left of the uterus and therefore I suspect that this is of ovarian origin.  The liver parenchyma shows a 1.7 cm cyst in the lateral segment of the left lobe and a 4 mm cyst in the right lobe.  No calcified gallstones.  The spleen is normal.  The pancreas is normal.  The adrenal glands are normal.  The kidneys are normal except for  a nonobstructing 3 mm stone in the lower pole the right kidney.  The aorta and IVC are normal.  No retroperitoneal mass or adenopathy. The uterus itself does not appear abnormal.  The bladder is unremarkable.  No bony abnormality.  IMPRESSION: Large amount of intraperitoneal hemorrhage, probably originating in the pelvis to the left of the uterus.  Therefore, I suspect that the origin is ovarian.  Critical Value/emergent results were called by telephone at the time of interpretation on 02/07/2012 at 1940 hours to Dr. Bebe Shaggy, who verbally acknowledged these results.   Original Report Authenticated By: Paulina Fusi, M.D.      Assessment: 41 y.o. female with abdominal pain and chronic uncontrolled HTN -imaging findings as above, consistent with ruptured ovarian cyst -Hb dropped from 11.0 to 9.1; pt bolused NS x1 L at [facility] -other labs mostly unremarkable; urine pregnancy test negative, GC/Chlamydia probe collected, wet prep with clue cells  -BP's intermittently elevated, systolic as high as 170's, diastolic as high as low 100's; pt historically intolerant of ACE and unable to afford ARB  Plan: -serial CBC's -NPO status; may need to proceed to OR if Hb continues to fall -Dilaudid, Zofran PRN; may consider adding Colace -HCTZ for BP control, SW consult for inability to obtain meds; may consider adding other agents (?beta-blocker)  Above discussed with Dr. Penne Lash, who has evaluated pt and discussed initial plan(s) with pt.  Anita Perkins 02/08/2012, 4:05 AM

## 2012-02-08 NOTE — Progress Notes (Signed)
CSW referral received for medication assistance.  CSW advised RN to alert case manager of need.  CSW signing off at this time.

## 2012-02-08 NOTE — Care Management Note (Signed)
Received call regarding medication assistance for HCTZ. Patient would be eligible (self pay) for [facility] medication assistance program. However, the copay for the medication assistance program is 3.00 dollars. HCTZ is on the 4.00 dollar list at [facility] without insurance. Essentially the cost would be the same. Faxed [employer] 4 dollar list to [employer], Charity fundraiser. Of note the patient told nursing she does go to the [facility]. Patient can also follow up with the [facility] for her medications as well.  Raiford Noble, MSN-Ed, BSN, [place] [phone].

## 2012-02-08 NOTE — Progress Notes (Signed)
Discharge instructions reviewed with patient.  Activity, Return visit, signs/symptoms to report to MD and medications discussed with patient.  No home equipment needed.  Wheelchair to car with staff without incident and discharged home with family.

## 2012-02-08 NOTE — H&P (Signed)
Pt seen and examined.  Physical exam findings are not consistent with peritonitis.  Pt has stable vital signs and hgb is stable at 8.6.  Will continue to monitor and provide pain relief.  It can take several weeks for resorption of blood.  Will treat hypertension and hopefully find pt a primary care MD  Adric Wrede H.

## 2012-02-08 NOTE — Progress Notes (Signed)
Subjective: Patient reports nausea and vomiting have decreased.  Tolerated lunch.  On po pain meds and pain is decreased.  Objective: I have reviewed patient's vital signs, intake and output, medications and labs.  General: alert, cooperative and appears stated age GI: normal findings: soft and abnormal findings:  mild tenderness in the RLQ and in the LLQ worse.   Assessment/Plan: Ruptured ovarian cyst, with drop in hemoglobin.  Pain is improved.  Hemoglobin is stable.  Tolerating PO. Ok for D/C  LOS: 1 day    Anita Perkins S 02/08/2012, 3:03 PM

## 2012-02-09 LAB — TYPE AND SCREEN
Antibody Screen: NEGATIVE
Unit division: 0

## 2012-02-14 ENCOUNTER — Encounter: Payer: Self-pay | Admitting: Medical

## 2012-02-28 ENCOUNTER — Ambulatory Visit (INDEPENDENT_AMBULATORY_CARE_PROVIDER_SITE_OTHER): Payer: Self-pay | Admitting: Medical

## 2012-02-28 ENCOUNTER — Encounter: Payer: Self-pay | Admitting: Medical

## 2012-02-28 ENCOUNTER — Encounter: Payer: Self-pay | Admitting: Obstetrics & Gynecology

## 2012-02-28 VITALS — BP 140/97 | HR 68 | Temp 98.4°F | Ht 66.5 in | Wt 186.0 lb

## 2012-02-28 DIAGNOSIS — N949 Unspecified condition associated with female genital organs and menstrual cycle: Secondary | ICD-10-CM

## 2012-02-28 DIAGNOSIS — I10 Essential (primary) hypertension: Secondary | ICD-10-CM

## 2012-02-28 DIAGNOSIS — R102 Pelvic and perineal pain: Secondary | ICD-10-CM

## 2012-02-28 MED ORDER — IBUPROFEN 600 MG PO TABS: 600.0000 mg | ORAL_TABLET | Freq: Four times a day (QID) | ORAL | Status: DC | PRN

## 2012-02-28 NOTE — Patient Instructions (Signed)
Ovarian Cyst The ovaries are small organs that are on each side of the uterus. The ovaries are the organs that produce the female hormones, estrogen and progesterone. An ovarian cyst is a sac filled with fluid that can vary in its size. It is normal for a small cyst to form in women who are in the childbearing age and who have menstrual periods. This type of cyst is called a follicle cyst that becomes an ovulation cyst (corpus luteum cyst) after it produces the women's egg. It later goes away on its own if the woman does not become pregnant. There are other kinds of ovarian cysts that may cause problems and may need to be treated. The most serious problem is a cyst with cancer. It should be noted that menopausal women who have an ovarian cyst are at a higher risk of it being a cancer cyst. They should be evaluated very quickly, thoroughly and followed closely. This is especially true in menopausal women because of the high rate of ovarian cancer in women in menopause. CAUSES AND TYPES OF OVARIAN CYSTS:  FUNCTIONAL CYST: The follicle/corpus luteum cyst is a functional cyst that occurs every month during ovulation with the menstrual cycle. They go away with the next menstrual cycle if the woman does not get pregnant. Usually, there are no symptoms with a functional cyst.  ENDOMETRIOMA CYST: This cyst develops from the lining of the uterus tissue. This cyst gets in or on the ovary. It grows every month from the bleeding during the menstrual period. It is also called a "chocolate cyst" because it becomes filled with blood that turns brown. This cyst can cause pain in the lower abdomen during intercourse and with your menstrual period.  CYSTADENOMA CYST: This cyst develops from the cells on the outside of the ovary. They usually are not cancerous. They can get very big and cause lower abdomen pain and pain with intercourse. This type of cyst can twist on itself, cut off its blood supply and cause severe pain. It  also can easily rupture and cause a lot of pain.  DERMOID CYST: This type of cyst is sometimes found in both ovaries. They are found to have different kinds of body tissue in the cyst. The tissue includes skin, teeth, hair, and/or cartilage. They usually do not have symptoms unless they get very big. Dermoid cysts are rarely cancerous.  POLYCYSTIC OVARY: This is a rare condition with hormone problems that produces many small cysts on both ovaries. The cysts are follicle-like cysts that never produce an egg and become a corpus luteum. It can cause an increase in body weight, infertility, acne, increase in body and facial hair and lack of menstrual periods or rare menstrual periods. Many women with this problem develop type 2 diabetes. The exact cause of this problem is unknown. A polycystic ovary is rarely cancerous.  THECA LUTEIN CYST: Occurs when too much hormone (human chorionic gonadotropin) is produced and over-stimulates the ovaries to produce an egg. They are frequently seen when doctors stimulate the ovaries for invitro-fertilization (test tube babies).  LUTEOMA CYST: This cyst is seen during pregnancy. Rarely it can cause an obstruction to the birth canal during labor and delivery. They usually go away after delivery. SYMPTOMS   Pelvic pain or pressure.  Pain during sexual intercourse.  Increasing girth (swelling) of the abdomen.  Abnormal menstrual periods.  Increasing pain with menstrual periods.  You stop having menstrual periods and you are not pregnant. DIAGNOSIS  The diagnosis can   be made during:  Routine or annual pelvic examination (common).  Ultrasound.  X-ray of the pelvis.  CT Scan.  MRI.  Blood tests. TREATMENT   Treatment may only be to follow the cyst monthly for 2 to 3 months with your caregiver. Many go away on their own, especially functional cysts.  May be aspirated (drained) with a long needle with ultrasound, or by laparoscopy (inserting a tube into  the pelvis through a small incision).  The whole cyst can be removed by laparoscopy.  Sometimes the cyst may need to be removed through an incision in the lower abdomen.  Hormone treatment is sometimes used to help dissolve certain cysts.  Birth control pills are sometimes used to help dissolve certain cysts. HOME CARE INSTRUCTIONS  Follow your caregiver's advice regarding:  Medicine.  Follow up visits to evaluate and treat the cyst.  You may need to come back or make an appointment with another caregiver, to find the exact cause of your cyst, if your caregiver is not a gynecologist.  Get your yearly and recommended pelvic examinations and Pap tests.  Let your caregiver know if you have had an ovarian cyst in the past. SEEK MEDICAL CARE IF:   Your periods are late, irregular, they stop, or are painful.  Your stomach (abdomen) or pelvic pain does not go away.  Your stomach becomes larger or swollen.  You have pressure on your bladder or trouble emptying your bladder completely.  You have painful sexual intercourse.  You have feelings of fullness, pressure, or discomfort in your stomach.  You lose weight for no apparent reason.  You feel generally ill.  You become constipated.  You lose your appetite.  You develop acne.  You have an increase in body and facial hair.  You are gaining weight, without changing your exercise and eating habits.  You think you are pregnant. SEEK IMMEDIATE MEDICAL CARE IF:   You have increasing abdominal pain.  You feel sick to your stomach (nausea) and/or vomit.  You develop a fever that comes on suddenly.  You develop abdominal pain during a bowel movement.  Your menstrual periods become heavier than usual. Document Released: 01/28/2005 Document Revised: 04/22/2011 Document Reviewed: 12/01/2008 [employer] Patient Information 2013 [employer].  

## 2012-02-28 NOTE — Progress Notes (Signed)
Patient is having some pain with urination.  She notices some swelling in her abdominal region.  She is experiencing pain.  The pain went away after your ER visit but recently has started to come back.  Standing all day at work sometimes increases her pain.  It is mostly when she is going to the bathroom that it hurts the most.  Not burning with urination or vaginal itching.

## 2012-02-28 NOTE — Progress Notes (Signed)
Patient ID: Anita Perkins, female   DOB: 1970-05-22, 42 y.o.   MRN: [id number]  History:  [name] is a 42 y.o. female who presents to clinic today for follow-up from inpatient care for ruptured hemorrhagic ovarian cyst. The patient feels as though her pain has improved significantly since she left the hospital. She rates her pain at 5/10 today. The patient denies N/V/D or fevers. Her pain is mostly localized to the LLQ. The patient is complaining of dysuria today. She also has increased frequency and urgency of urination. She feels that she has had difficulty with complete emptying of the bladder. All of these urinary symptoms have been present for approximately 3 days. The patient also states that she is having to get up to urinate at night 3+ times. She does drink a lot of water throughout the day and night and also a lot of wine in the evenings. The patient often will drink one whole bottle of wine in one night. She states that her drinking does not interfere with her daily activities. She does not miss work because of drinking. She states that she does not drink before 6 pm. She denies drinking in the morning when she has a hangover. The patient also has a diagnosis of hypertension. Previously she was prescribed medication that she was unable to get because of financial issues. The patient has been trying to improve her diet, less sodium, less canned foods and more fruits and vegetables. She is now taking the HCTZ prescribed in the hospital. She does have frequent headaches, none today. She denies vision changes, upper abdominal pain or peripheral edema.    The following portions of the patient's history were reviewed and updated as appropriate: allergies, current medications, past family history, past medical history, past social history, past surgical history and problem list.  Review of Systems:  Pertinent items are noted in HPI.  Objective:  Physical Exam BP 140/97  Pulse 68  Temp  98.4 F (36.9 C) (Oral)  Ht 5' 6.5" (1.689 m)  Wt 186 lb (84.369 kg)  BMI 29.57 kg/m2  LMP 02/14/2012 GENERAL: Well-developed, well-nourished female in no acute distress.  HEENT: Normocephalic, atraumatic.   LUNGS: Normal rate. Clear to auscultation bilaterally.  HEART: Regular rate and rhythm with no adventitious sounds.  ABDOMEN: Soft, nontender, nondistended. No organomegaly.  BIMANUAL: Uterus is normal in size. Mild adnexal tenderness on the left none on the right. No adnexal mass.  PELVIC: Normal external female genitalia. EXTREMITIES: No cyanosis, clubbing, or edema.   Labs and Imaging US Transvaginal Non-ob  02/08/2012  *RADIOLOGY REPORT*  Clinical Data: Intraperitoneal blood noted on CT; abdominal pain.  TRANSABDOMINAL AND TRANSVAGINAL ULTRASOUND OF PELVIS Technique:  Both transabdominal and transvaginal ultrasound examinations of the pelvis were performed. Transabdominal technique was performed for global imaging of the pelvis including uterus, ovaries, adnexal regions, and pelvic cul-de-sac.  It was necessary to proceed with endovaginal exam following the transabdominal exam to visualize the uterus and ovaries in greater detail.  Comparison:  None  Findings:  Uterus: Normal in size and appearance; measures 10.2 x 5.0 x 6.9 cm.  Endometrium: Normal in thickness and appearance; measures 1.1 cm in thickness.  Right ovary:  Normal appearance/no adnexal mass; measures 3.4 x 1.7 x 1.7 cm.  Left ovary: Normal appearance/no adnexal mass; measures 3.6 x 2.5 x 2.9 cm.  Other findings: A large amount of free fluid is noted within the pelvis, somewhat complex in nature, with a large amount of clot noted  at the left side of the pelvis, surrounding and extending above the left adnexa.  IMPRESSION: 1.  Uterus and ovaries grossly unremarkable in appearance. 2.  Large amount of clot noted at the left side of the pelvis, surrounding and extending above the left adnexa, as seen on CT. Large amount of  associated complex serosanguinous fluid seen. Given the location of the clot, this most likely reflects rupture of a left-sided hemorrhagic ovarian cyst, with associated unusually significant bleeding at the site of rupture.   Original Report Authenticated By: Tonia Ghent, M.D.    US Pelvis Complete  02/08/2012  *RADIOLOGY REPORT*  Clinical Data: Intraperitoneal blood noted on CT; abdominal pain.  TRANSABDOMINAL AND TRANSVAGINAL ULTRASOUND OF PELVIS Technique:  Both transabdominal and transvaginal ultrasound examinations of the pelvis were performed. Transabdominal technique was performed for global imaging of the pelvis including uterus, ovaries, adnexal regions, and pelvic cul-de-sac.  It was necessary to proceed with endovaginal exam following the transabdominal exam to visualize the uterus and ovaries in greater detail.  Comparison:  None  Findings:  Uterus: Normal in size and appearance; measures 10.2 x 5.0 x 6.9 cm.  Endometrium: Normal in thickness and appearance; measures 1.1 cm in thickness.  Right ovary:  Normal appearance/no adnexal mass; measures 3.4 x 1.7 x 1.7 cm.  Left ovary: Normal appearance/no adnexal mass; measures 3.6 x 2.5 x 2.9 cm.  Other findings: A large amount of free fluid is noted within the pelvis, somewhat complex in nature, with a large amount of clot noted at the left side of the pelvis, surrounding and extending above the left adnexa.  IMPRESSION: 1.  Uterus and ovaries grossly unremarkable in appearance. 2.  Large amount of clot noted at the left side of the pelvis, surrounding and extending above the left adnexa, as seen on CT. Large amount of associated complex serosanguinous fluid seen. Given the location of the clot, this most likely reflects rupture of a left-sided hemorrhagic ovarian cyst, with associated unusually significant bleeding at the site of rupture.   Original Report Authenticated By: Tonia Ghent, M.D.    Ct Abdomen Pelvis W Contrast  02/07/2012   *RADIOLOGY REPORT*  Clinical Data: Acute onset of severe abdominal pain.  CT ABDOMEN AND PELVIS WITH CONTRAST  Technique:  Multidetector CT imaging of the abdomen and pelvis was performed following the standard protocol during bolus administration of intravenous contrast.  Contrast: OMNIPAQUE IOHEXOL 300 MG/ML  SOLN  Comparison: 02/25/2003  Findings: Lung bases are clear.  No pleural or pericardial fluid.  There is a large amount of intraperitoneal fluid which is hyperdense consistent with intraperitoneal hemorrhage.  This source is probably in the pelvis adjacent to and to the left of the uterus and therefore I suspect that this is of ovarian origin.  The liver parenchyma shows a 1.7 cm cyst in the lateral segment of the left lobe and a 4 mm cyst in the right lobe.  No calcified gallstones.  The spleen is normal.  The pancreas is normal.  The adrenal glands are normal.  The kidneys are normal except for a nonobstructing 3 mm stone in the lower pole the right kidney.  The aorta and IVC are normal.  No retroperitoneal mass or adenopathy. The uterus itself does not appear abnormal.  The bladder is unremarkable.  No bony abnormality.  IMPRESSION: Large amount of intraperitoneal hemorrhage, probably originating in the pelvis to the left of the uterus.  Therefore, I suspect that the origin is ovarian.  Critical Value/emergent  results were called by telephone at the time of interpretation on 02/07/2012 at 1940 hours to Dr. Bebe Shaggy, who verbally acknowledged these results.   Original Report Authenticated By: Paulina Fusi, M.D.    UA - negative  Assessment & Plan:  Assessment: Rupture ovarian cyst with hemoperitoneum, resolving  Plans: Referral to [facility] for hypertension management Rx for ibuprofen sent to patient's pharmacy.  In depth discussion about alcohol intake, use to tylenol and ibuprofen and goody powders.  Patient may follow-up PRN  Freddi Starr, PA-C 02/28/2012 10:56 AM

## 2012-03-02 ENCOUNTER — Encounter: Payer: Self-pay | Admitting: *Deleted

## 2012-08-23 ENCOUNTER — Encounter (HOSPITAL_COMMUNITY): Payer: Self-pay | Admitting: *Deleted

## 2012-08-23 ENCOUNTER — Emergency Department (HOSPITAL_COMMUNITY)
Admission: EM | Admit: 2012-08-23 | Discharge: 2012-08-23 | Disposition: A | Discharge status: Home / Self Care | Payer: BC Managed Care – PPO | Attending: Emergency Medicine | Admitting: Emergency Medicine

## 2012-08-23 DIAGNOSIS — Z9889 Other specified postprocedural states: Secondary | ICD-10-CM | POA: Insufficient documentation

## 2012-08-23 DIAGNOSIS — Z9851 Tubal ligation status: Secondary | ICD-10-CM | POA: Insufficient documentation

## 2012-08-23 DIAGNOSIS — I10 Essential (primary) hypertension: Secondary | ICD-10-CM | POA: Insufficient documentation

## 2012-08-23 DIAGNOSIS — S01501A Unspecified open wound of lip, initial encounter: Secondary | ICD-10-CM | POA: Insufficient documentation

## 2012-08-23 DIAGNOSIS — Z8742 Personal history of other diseases of the female genital tract: Secondary | ICD-10-CM | POA: Insufficient documentation

## 2012-08-23 DIAGNOSIS — S01511A Laceration without foreign body of lip, initial encounter: Secondary | ICD-10-CM

## 2012-08-23 DIAGNOSIS — Z880 Allergy status to penicillin: Secondary | ICD-10-CM | POA: Insufficient documentation

## 2012-08-23 MED ORDER — HYDROCODONE-ACETAMINOPHEN 5-325 MG PO TABS: 1.0000 | ORAL_TABLET | ORAL | Status: DC | PRN
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
## 2012-08-23 NOTE — ED Provider Notes (Signed)
Medical screening examination/treatment/procedure(s) were performed by non-physician practitioner and as supervising physician I was immediately available for consultation/collaboration.   Estefanie Cornforth, MD 08/23/12 0618 

## 2012-10-27 ENCOUNTER — Encounter (HOSPITAL_COMMUNITY): Payer: Self-pay | Admitting: *Deleted

## 2012-10-27 ENCOUNTER — Emergency Department (HOSPITAL_COMMUNITY)
Admission: EM | Admit: 2012-10-27 | Discharge: 2012-10-27 | Disposition: A | Discharge status: Home / Self Care | Payer: BC Managed Care – PPO | Attending: Emergency Medicine | Admitting: Emergency Medicine

## 2012-10-27 ENCOUNTER — Emergency Department (HOSPITAL_COMMUNITY): Payer: BC Managed Care – PPO

## 2012-10-27 DIAGNOSIS — Z880 Allergy status to penicillin: Secondary | ICD-10-CM | POA: Insufficient documentation

## 2012-10-27 DIAGNOSIS — Z9889 Other specified postprocedural states: Secondary | ICD-10-CM | POA: Insufficient documentation

## 2012-10-27 DIAGNOSIS — Z9851 Tubal ligation status: Secondary | ICD-10-CM | POA: Insufficient documentation

## 2012-10-27 DIAGNOSIS — Z8742 Personal history of other diseases of the female genital tract: Secondary | ICD-10-CM | POA: Insufficient documentation

## 2012-10-27 DIAGNOSIS — R079 Chest pain, unspecified: Secondary | ICD-10-CM

## 2012-10-27 DIAGNOSIS — I10 Essential (primary) hypertension: Secondary | ICD-10-CM | POA: Insufficient documentation

## 2012-10-27 LAB — BASIC METABOLIC PANEL
CO2: 26 mEq/L (ref 19–32)
Calcium: 9.5 mg/dL (ref 8.4–10.5)
GFR calc non Af Amer: 78 mL/min — ABNORMAL LOW (ref >90)
Sodium: 137 mEq/L (ref 135–145)

## 2012-10-27 LAB — POCT I-STAT TROPONIN I: Troponin i, poc: 0 ng/mL (ref 0.00–0.08)

## 2012-10-27 LAB — CBC WITH DIFFERENTIAL/PLATELET
Basophils Absolute: 0 10*3/uL (ref 0.0–0.1)
Eosinophils Relative: 3 % (ref 0–5)
Lymphocytes Relative: 38 % (ref 12–46)
MCV: 86.9 fL (ref 78.0–100.0)
Platelets: 197 10*3/uL (ref 150–400)
RDW: 13.4 % (ref 11.5–15.5)
WBC: 5.6 10*3/uL (ref 4.0–10.5)

## 2012-10-27 NOTE — ED Provider Notes (Signed)
CSN: [id number]     Arrival date & time 10/27/12  4010 History   First MD Initiated Contact with Patient 10/27/12 0601     Chief Complaint  Patient presents with  . Chest Pain   (Consider location/radiation/quality/duration/timing/severity/associated sxs/prior Treatment) HPI Comments: Patient is a 46-40 year old female with a past medical history of hypertension who presents with chest pain that started suddenly this morning. Patient states the pain woke her up from sleep prior to arrival. The pain is located in her central chest without radiation. The pain was sharp and severe. Patient reports the pain caused her to "double over." The pain lasted about 1 hour before spontaneously resolving. Patient received 324mg  ASA and nitro by EMS. Patient is asymptomatic at this time. No aggravating/alleviating factors. No other associated symptoms.   Patient is a 42 y.o. female presenting with chest pain.  Chest Pain   Past Medical History  Diagnosis Date  . Hx of appendectomy   . H/O tubal ligation   . Hypertension   . Ovarian cyst rupture    Past Surgical History  Procedure Laterality Date  . Appendectomy    . Tubal ligation     History reviewed. No pertinent family history. History  Substance Use Topics  . Smoking status: Never Smoker   . Smokeless tobacco: Not on file  . Alcohol Use: Yes     Comment: 1-2 bottles wine/ every other day   OB History   Grav Para Term Preterm Abortions TAB SAB Ect Mult Living                 Review of Systems  Cardiovascular: Positive for chest pain.  All other systems reviewed and are negative.    Allergies  Penicillins  Home Medications   Current Outpatient Rx  Name  Route  Sig  Dispense  Refill  . Aspirin-Acetaminophen (GOODY BODY PAIN) 500-325 MG PACK   Oral   Take 1 Package by mouth every 6 (six) hours as needed (for pain).          BP 158/96  Pulse 60  Temp(Src) 98.6 F (37 C) (Oral)  Resp 14  SpO2 100%  LMP  09/16/2012 Physical Exam  Nursing note and vitals reviewed. Constitutional: She is oriented to person, place, and time. She appears well-developed and well-nourished. No distress.  HENT:  Head: Normocephalic and atraumatic.  Eyes: Conjunctivae and EOM are normal.  Neck: Normal range of motion. Neck supple.  Cardiovascular: Normal rate and regular rhythm.  Exam reveals no gallop and no friction rub.   No murmur heard. Pulmonary/Chest: Effort normal and breath sounds normal. She has no wheezes. She has no rales. She exhibits no tenderness.  Abdominal: Soft. She exhibits no distension. There is no tenderness. There is no rebound and no guarding.  Musculoskeletal: Normal range of motion.  Neurological: She is alert and oriented to person, place, and time. Coordination normal.  Speech is goal-oriented. Moves limbs without ataxia.   Skin: Skin is warm and dry.  Psychiatric: She has a normal mood and affect. Her behavior is normal.    ED Course  Procedures (including critical care time)   Date: 10/27/2012  Rate: 60  Rhythm: normal sinus rhythm  QRS Axis: normal  Intervals: normal  ST/T Wave abnormalities: nonspecific T wave changes  Conduction Disutrbances:none  Narrative Interpretation: NSR with nonspecific T wave changes unchanged from previous  Old EKG Reviewed: unchanged   Labs Review Labs Reviewed  BASIC METABOLIC PANEL - Abnormal; Notable  for the following:    Glucose, Bld 109 (*)    GFR calc non Af Amer 78 (*)    All other components within normal limits  CBC WITH DIFFERENTIAL  POCT I-STAT TROPONIN I   Imaging Review Dg Chest 2 View  10/27/2012   *RADIOLOGY REPORT*  Clinical Data: Chest pain  CHEST - 2 VIEW  Comparison: None.  Findings: Cardiac and mediastinal silhouettes are within normal limits.  The lungs are normally inflated.  No airspace consolidation, pleural effusion, or pulmonary edema is identified.  There is no pneumothorax.  No acute osseous abnormality  identified.  IMPRESSION: No acute cardiopulmonary process.   Original Report Authenticated By: Rise Mu, M.D.    MDM   1. Chest pain     6:59 AM Labs and troponin unremarkable. Chest xray unremarkable for acute changes. Patient denies any chest pain currently. Vitals stable and patient afebrile. Patient is PERC negative.   7:37 AM Dr. Bebe Shaggy saw the patient thinks she can be discharged. I will discharge the patient with instructions to return with worsening or concerning symptoms. Vitals stable and patient afebrile. No acute EKG changes.     Emilia Beck, [place] 10/27/12 [phone]

## 2012-10-27 NOTE — ED Provider Notes (Signed)
Medical screening examination/treatment/procedure(s) were performed by non-physician practitioner and as supervising physician I was immediately available for consultation/collaboration.  Geoffery Lyons, MD 10/27/12 2036

## 2012-10-27 NOTE — ED Notes (Signed)
Pt ambulatory to b/r with steady gait, family at [facility].

## 2012-10-27 NOTE — ED Provider Notes (Signed)
Patient seen/examined in the Emergency Department in conjunction with [facility] Patient reports chest pain Exam : awake/alert, no distress, eating breakfast Plan: pt told me pain was sharp, positional and worse with palpation.  She is now pain free.  I doubt PE/ACS at this time.  Stable for d/c.  I have reviewed EKG, no significant change when compared to prior.  I offered further testing (repeat troponin) but patient refuses and wants to go home   Joya Gaskins, MD 10/27/12 [phone]

## 2012-10-27 NOTE — ED Notes (Signed)
Lab at BS 

## 2012-10-27 NOTE — ED Notes (Addendum)
Here from home by EMS for CP, better after ASA 324mg  by EMS PTA, per EMS "not sure it isn't r/t anxiety", some pain was present to palpation per EMS, "feels better on arrival", pt ambulatory from stretcher to stretcher, alert, NAD, calm, interactive, resps e/u, speaking in clear complete sentences. Pt denies h/o anxiety or panic attacks

## 2015-07-31 ENCOUNTER — Emergency Department (HOSPITAL_COMMUNITY): Payer: Self-pay

## 2015-07-31 ENCOUNTER — Other Ambulatory Visit: Payer: Self-pay

## 2015-07-31 ENCOUNTER — Emergency Department (HOSPITAL_COMMUNITY)
Admission: EM | Admit: 2015-07-31 | Discharge: 2015-07-31 | Disposition: A | Discharge status: Home / Self Care | Payer: Self-pay | Attending: Emergency Medicine | Admitting: Emergency Medicine

## 2015-07-31 DIAGNOSIS — F1410 Cocaine abuse, uncomplicated: Secondary | ICD-10-CM | POA: Insufficient documentation

## 2015-07-31 DIAGNOSIS — Z79899 Other long term (current) drug therapy: Secondary | ICD-10-CM | POA: Insufficient documentation

## 2015-07-31 DIAGNOSIS — Z7982 Long term (current) use of aspirin: Secondary | ICD-10-CM | POA: Insufficient documentation

## 2015-07-31 DIAGNOSIS — I10 Essential (primary) hypertension: Secondary | ICD-10-CM | POA: Insufficient documentation

## 2015-07-31 DIAGNOSIS — K529 Noninfective gastroenteritis and colitis, unspecified: Secondary | ICD-10-CM | POA: Insufficient documentation

## 2015-07-31 LAB — CBC WITH DIFFERENTIAL/PLATELET
BASOS PCT: 0 %
Basophils Absolute: 0 10*3/uL (ref 0.0–0.1)
EOS ABS: 0 10*3/uL (ref 0.0–0.7)
Eosinophils Relative: 1 %
HCT: 34.5 % — ABNORMAL LOW (ref 36.0–46.0)
HEMOGLOBIN: 11.6 g/dL — AB (ref 12.0–15.0)
LYMPHS ABS: 0.4 10*3/uL — AB (ref 0.7–4.0)
Lymphocytes Relative: 8 %
MCH: 29.9 pg (ref 26.0–34.0)
MCHC: 33.6 g/dL (ref 30.0–36.0)
MCV: 88.9 fL (ref 78.0–100.0)
MONO ABS: 0.4 10*3/uL (ref 0.1–1.0)
MONOS PCT: 9 %
NEUTROS PCT: 82 %
Neutro Abs: 3.8 10*3/uL (ref 1.7–7.7)
Platelets: 192 10*3/uL (ref 150–400)
RBC: 3.88 MIL/uL (ref 3.87–5.11)
RDW: 12.9 % (ref 11.5–15.5)
WBC: 4.6 10*3/uL (ref 4.0–10.5)

## 2015-07-31 LAB — URINALYSIS, ROUTINE W REFLEX MICROSCOPIC
BILIRUBIN URINE: NEGATIVE
Glucose, UA: NEGATIVE mg/dL
HGB URINE DIPSTICK: NEGATIVE
KETONES UR: NEGATIVE mg/dL
Leukocytes, UA: NEGATIVE
NITRITE: NEGATIVE
PH: 7 (ref 5.0–8.0)
Protein, ur: NEGATIVE mg/dL
SPECIFIC GRAVITY, URINE: 1.017 (ref 1.005–1.030)

## 2015-07-31 LAB — COMPREHENSIVE METABOLIC PANEL
ALK PHOS: 35 U/L — AB (ref 38–126)
ALT: 21 U/L (ref 14–54)
AST: 24 U/L (ref 15–41)
Albumin: 4.6 g/dL (ref 3.5–5.0)
Anion gap: 7 (ref 5–15)
BILIRUBIN TOTAL: 0.5 mg/dL (ref 0.3–1.2)
BUN: 14 mg/dL (ref 6–20)
CALCIUM: 9.1 mg/dL (ref 8.9–10.3)
CO2: 25 mmol/L (ref 22–32)
CREATININE: 0.77 mg/dL (ref 0.44–1.00)
Chloride: 106 mmol/L (ref 101–111)
Glucose, Bld: 99 mg/dL (ref 65–99)
Potassium: 3.4 mmol/L — ABNORMAL LOW (ref 3.5–5.1)
Sodium: 138 mmol/L (ref 135–145)
TOTAL PROTEIN: 7.5 g/dL (ref 6.5–8.1)

## 2015-07-31 LAB — I-STAT BETA HCG BLOOD, ED (MC, WL, AP ONLY)

## 2015-07-31 LAB — I-STAT TROPONIN, ED: Troponin i, poc: 0 ng/mL (ref 0.00–0.08)

## 2015-07-31 LAB — LIPASE, BLOOD: LIPASE: 14 U/L (ref 11–51)

## 2015-07-31 MED ORDER — DICYCLOMINE HCL 20 MG PO TABS: 20.0000 mg | ORAL_TABLET | Freq: Two times a day (BID) | ORAL | Status: DC

## 2015-07-31 MED ORDER — SODIUM CHLORIDE 0.9 % IV SOLN
INTRAVENOUS | Status: DC
  Administered 2015-07-31: 14:00:00 via INTRAVENOUS

## 2015-07-31 MED ORDER — METRONIDAZOLE IN NACL 5-0.79 MG/ML-% IV SOLN
500.0000 mg | Freq: Once | INTRAVENOUS | Status: AC
  Administered 2015-07-31: 500 mg via INTRAVENOUS
  Filled 2015-07-31: qty 100

## 2015-07-31 MED ORDER — CIPROFLOXACIN HCL 500 MG PO TABS: 500.0000 mg | ORAL_TABLET | Freq: Two times a day (BID) | ORAL | Status: DC

## 2015-07-31 MED ORDER — ONDANSETRON HCL 4 MG/2ML IJ SOLN
4.0000 mg | Freq: Once | INTRAMUSCULAR | Status: AC
  Administered 2015-07-31: 4 mg via INTRAVENOUS
  Filled 2015-07-31: qty 2

## 2015-07-31 MED ORDER — METRONIDAZOLE 500 MG PO TABS
500.0000 mg | ORAL_TABLET | Freq: Two times a day (BID) | ORAL | Status: DC
Start: 2015-07-31 — End: 2015-08-21

## 2015-07-31 MED ORDER — SODIUM CHLORIDE 0.9 % IV BOLUS (SEPSIS)
1000.0000 mL | Freq: Once | INTRAVENOUS | Status: AC
  Administered 2015-07-31: 1000 mL via INTRAVENOUS

## 2015-07-31 MED ORDER — CIPROFLOXACIN IN D5W 400 MG/200ML IV SOLN
400.0000 mg | Freq: Once | INTRAVENOUS | Status: AC
  Administered 2015-07-31: 400 mg via INTRAVENOUS
  Filled 2015-07-31: qty 200

## 2015-07-31 MED ORDER — FLUCONAZOLE 200 MG PO TABS: 200.0000 mg | ORAL_TABLET | Freq: Every day | ORAL | Status: AC

## 2015-07-31 MED ORDER — KETOROLAC TROMETHAMINE 30 MG/ML IJ SOLN
15.0000 mg | Freq: Once | INTRAMUSCULAR | Status: AC
Start: 2015-07-31 — End: 2015-07-31
  Administered 2015-07-31: 15 mg via INTRAVENOUS
  Filled 2015-07-31: qty 1

## 2015-07-31 MED ORDER — GI COCKTAIL ~~LOC~~
30.0000 mL | Freq: Once | ORAL | Status: AC
  Administered 2015-07-31: 30 mL via ORAL
  Filled 2015-07-31: qty 30

## 2015-07-31 MED ORDER — LIDOCAINE VISCOUS 2 % MT SOLN
15.0000 mL | Freq: Once | OROMUCOSAL | Status: AC
Start: 2015-07-31 — End: 2015-07-31
  Administered 2015-07-31: 15 mL via OROMUCOSAL
  Filled 2015-07-31: qty 15

## 2015-07-31 MED ORDER — FENTANYL CITRATE (PF) 100 MCG/2ML IJ SOLN
25.0000 ug | Freq: Once | INTRAMUSCULAR | Status: AC
  Administered 2015-07-31: 25 ug via INTRAVENOUS
  Filled 2015-07-31: qty 2

## 2015-07-31 NOTE — ED Provider Notes (Signed)
CSN: [id number]     Arrival date & time 07/31/15  1123 History   First MD Initiated Contact with Patient 07/31/15 1152     Chief Complaint  Patient presents with  . Abdominal Pain    HPI  Patient presents with concern of back pain, belly pain, nausea, diarrhea, and now chest pain. Patient states for the past 3 days she has had anorexia, nausea, multiple episodes of diarrhea, and some abdominal discomfort in the upper abdomen. Today, just prior to ED arrival she developed some tightness in the sternal area. No dyspnea, no syncope. No attempts at relief with anything today, and prior attempts with OTC medication were unsuccessful.   Past Medical History  Diagnosis Date  . Hx of appendectomy   . H/O tubal ligation   . Hypertension   . Ovarian cyst rupture    Past Surgical History  Procedure Laterality Date  . Appendectomy    . Tubal ligation     No family history on file. Social History  Substance Use Topics  . Smoking status: Never Smoker   . Smokeless tobacco: Not on file  . Alcohol Use: Yes     Comment: 1-2 bottles wine/ every other day   OB History    No data available     Review of Systems  Constitutional:       Per HPI, otherwise negative  HENT:       Per HPI, otherwise negative  Respiratory:       Per HPI, otherwise negative  Cardiovascular:       Per HPI, otherwise negative  Gastrointestinal: Positive for nausea and diarrhea. Negative for vomiting.  Endocrine:       Negative aside from HPI  Genitourinary:       Neg aside from HPI   Musculoskeletal:       Per HPI, otherwise negative  Skin: Negative.   Neurological: Negative for syncope.      Allergies  Penicillins  Home Medications   Prior to Admission medications   Medication Sig Start Date End Date Taking? Authorizing Provider  Ascorbic Acid (VITAMIN C PO) Take 2 tablets by mouth daily.   Yes Historical Provider, MD  Aspirin-Acetaminophen (GOODY BODY PAIN) 500-325 MG PACK Take 1 Package by  mouth every 6 (six) hours as needed (for pain).   Yes Historical Provider, MD  Cyanocobalamin (B-12 PO) Take 1 tablet by mouth daily.   Yes Historical Provider, MD  GARLIC PO Take 1 tablet by mouth daily.   Yes Historical Provider, MD  Omega-3 Fatty Acids (FISH OIL PO) Take 1 tablet by mouth daily.   Yes Historical Provider, MD  VITAMIN E PO Take 3 tablets by mouth daily.   Yes Historical Provider, MD   BP 148/94 mmHg  Pulse 86  Temp(Src) 99.2 F (37.3 C) (Oral)  Resp 19  Ht 5' 6.5" (1.689 m)  Wt 176 lb (79.833 kg)  BMI 27.98 kg/m2  SpO2 98%  LMP 07/23/2015 Physical Exam  Constitutional: She is oriented to person, place, and time. She appears well-developed and well-nourished. No distress.  HENT:  Head: Normocephalic and atraumatic.  Eyes: Conjunctivae and EOM are normal.  Cardiovascular: Normal rate and regular rhythm.   Pulmonary/Chest: Effort normal and breath sounds normal. No stridor. No respiratory distress.  Abdominal: She exhibits no distension. There is no tenderness.  Musculoskeletal: She exhibits no edema.  Neurological: She is alert and oriented to person, place, and time. No cranial nerve deficit.  Skin: Skin is warm  and dry.  Psychiatric: She has a normal mood and affect.  Nursing note and vitals reviewed.   ED Course  Procedures (including critical care time) Labs Review Labs Reviewed  COMPREHENSIVE METABOLIC PANEL - Abnormal; Notable for the following:    Potassium 3.4 (*)    Alkaline Phosphatase 35 (*)    All other components within normal limits  CBC WITH DIFFERENTIAL/PLATELET - Abnormal; Notable for the following:    Hemoglobin 11.6 (*)    HCT 34.5 (*)    Lymphs Abs 0.4 (*)    All other components within normal limits  URINALYSIS, ROUTINE W REFLEX MICROSCOPIC (NOT AT [facility]) - Abnormal; Notable for the following:    APPearance CLOUDY (*)    All other components within normal limits  LIPASE, BLOOD  I-STAT TROPOININ, ED  I-STAT BETA HCG BLOOD, ED ([facility],  [facility], [facility] ONLY)    Imaging Review Dg Chest 2 View  07/31/2015  CLINICAL DATA:  N/V/D for several days with new onset mid chest pain, pressure, sob this a.m at work, nonsmoker states fever this a.m, EXAM: CHEST  2 VIEW COMPARISON:  10/27/2012 FINDINGS: Midline trachea.  Normal heart size and mediastinal contours. Sharp costophrenic angles.  No pneumothorax.  Clear lungs. IMPRESSION: No active cardiopulmonary disease. Electronically Signed   By: Jeronimo Greaves M.D.   On: 07/31/2015 12:15   Ct Renal Stone Study  07/31/2015  CLINICAL DATA:  Abdominal pain. Nausea and vomiting for days. Low pelvic pain. Left flank pain. Suprapubic pain. EXAM: CT ABDOMEN AND PELVIS WITHOUT CONTRAST TECHNIQUE: Multidetector CT imaging of the abdomen and pelvis was performed following the standard protocol without IV contrast. COMPARISON:  Multiple exams, including 02/08/2012 and 02/07/2012 FINDINGS: Lower chest:  Mild cardiomegaly. Hepatobiliary: 2.3 by 2.0 cm hypodense lesion laterally in the lateral segment left hepatic lobe, previously 1.5 by 1.1 cm on 02/07/2012. Fluid density internally. Several tiny lesions in the right hepatic lobe including a 0.6 by 0.5 cm lesion on image 33/2, technically too small to characterize, slightly larger than on 02/07/2012. Mildly contracted gallbladder. Pancreas: Unremarkable Spleen: Unremarkable aside from a small accessory spleen on image 20/2. Adrenals/Urinary Tract: Adrenal glands normal. 2 mm right kidney lower pole nonobstructive calculus. Faint 1 mm right kidney upper pole nonobstructive calculus. No definite ureteral calculus on the right. On the left there is a 2 mm upper pole nonobstructive calculus, no hydronephrosis or hydroureter, and no left ureteral calculus observed. Stomach/Bowel: Wall thickening in the stomach is probably secondary to nondistention but technically nonspecific. Appendectomy. No dilated bowel. The colon is mostly empty. There are some scattered air-fluid levels in  nondilated small bowel. Vascular/Lymphatic: Unremarkable Reproductive: Uterus 10.3 cm in diameter, minimally prominent dorsally. Other: Anteriorly in the left lower quadrant there is a 1.4 by 1.2 by 1.0 cm (volume = 0.874 cc) hypodense structure, 16 Hounsfield units, image 68/2. On 02/07/2012 if this was present it was obscured by free fluid. On 02/25/2003, this area was also indistinct from fluid-fluid and adjacent ovary. Musculoskeletal: Unremarkable IMPRESSION: 1. Scattered air-fluid levels in nondilated small bowel could reflect a low-grade enteritis. No obstructive process identified. A specific cause for left flank pain is not seen. 2. Oval-shaped hypodense lesion in the left lower quadrant along the lower omental margin, probably a low-density mildly enlarged lymph node, possibly a small complex residual fluid collection, technically nonspecific. 3. Mild cardiomegaly. 4. Bilateral nephrolithiasis, without ureteral calculus or obstruction. 5. Wall thickening in the stomach probably secondary to nondistention, gastritis not completely excluded. 6. Enlarging  fluid density lesion in the lateral segment left hepatic lobe, probably a cyst, enhancement not assessed on today's noncontrast exam. Small right hepatic lobe lesion, likewise most likely a cyst but technically nonspecific. Electronically Signed   By: Gaylyn Rong M.D.   On: 07/31/2015 14:55   I have personally reviewed and evaluated these images and lab results as part of my medical decision-making.   EKG Interpretation   Date/Time:  Monday July 31 2015 11:47:35 EDT Ventricular Rate:  87 PR Interval:    QRS Duration: 84 QT Interval:  344 QTC Calculation: 414 R Axis:   5 Text Interpretation:  Sinus rhythm Left ventricular hypertrophy ST-t wave  abnormality No significant change since last tracing Abnormal ekg  Confirmed by Gerhard Munch  MD [phone]) on 07/31/2015 12:00:36 PM Also  confirmed by Gerhard Munch  MD ([id number]), editor Stout  CT, Jola Babinski [phone])   on 07/31/2015 12:22:54 PM     3:19 PM After the initial intervention with analgesia, antiemetic, initial results discussed with the patient. She continued to have lower abdominal, and diffuse abdominal pain. CT scan ordered.  CT scan consistent with enteritis, given the patient's fever, she'll be started on antibiotics, reviewed receive additional analgesia.  4:54 PM Patient tolerating oral intake, aware of all findings, will follow-up with GI after completion of antibiotics. MDM   Patient presents with new abdominal discomfort, occasional chest pain. She is awake, alert, with no evidence for ongoing coronary ischemia, though there is evidence for enteritis. Patient had substantial improvement here, was tolerant of oral intake and after initial antibiotic administration, the patient was discharged in stable condition with continued medication, to follow-up with gastroenterology.  Gerhard Munch, MD 07/31/15 1655

## 2015-07-31 NOTE — Discharge Instructions (Signed)
Please monitor your condition carefully, and do not hesitate to return here if you develop new, or concerning changes.

## 2015-07-31 NOTE — ED Notes (Signed)
Pt given sandwich and soda.  

## 2015-07-31 NOTE — Progress Notes (Addendum)
CM spoke with pt who [place].  CM discussed and provided written information to assist pt with determining choice for uninsured accepting pcps, discussed the importance of pcp vs EDP services for f/u care, www.needymeds.org, www.goodrx.com, discounted pharmacies and other [place] resources such as [facility] , [facility], affordable care act, financial assistance, uninsured dental services, [facility] med assist, DSS and  health department  Reviewed resources for [place] uninsured accepting pcps like [facility], [facility] medicine [facility] [address], [facility], [facility], local urgent care centers, [facility], [facility], general medical clinics, [facility] [place], [facility] plus others, medication resources, [facility] out patient pharmacies and housing Pt voiced understanding and appreciation of resources provided   Provided [facility] contact information Pt agreed to a referral Cm completed referral Pt to be contact by [facility] clinical liason Pt states has insurance via her job but forgot to bring her card in today Encouraged pt to return her card information to ED registration  Entered in d/c instructions Please use the resources provided to you in emergency room by case manager to assist you're your choice of doctor for follow up A referral for you has been sent to [facility] if you have not received a call in 3 days you may contact them Call Scherry RanKaren Andrianos at [phone] Tuesday-Friday [email or web address] These [place] uninsured resources provide possible primary care providers, resources for discounted medications, housing, dental resources, affordable care act information, plus other resources for [place]

## 2015-07-31 NOTE — ED Notes (Signed)
Per pt, states abdominal pain, N/V/D for days-almost passed out at work-multiple complaints

## 2015-07-31 NOTE — ED Notes (Signed)
PT ambulatory and independent at discharge.  

## 2015-08-19 ENCOUNTER — Encounter (HOSPITAL_COMMUNITY): Payer: Self-pay | Admitting: Emergency Medicine

## 2015-08-19 ENCOUNTER — Emergency Department (HOSPITAL_COMMUNITY)
Admission: EM | Admit: 2015-08-19 | Discharge: 2015-08-19 | Disposition: A | Discharge status: Home / Self Care | Payer: Self-pay | Attending: Emergency Medicine | Admitting: Emergency Medicine

## 2015-08-19 DIAGNOSIS — Z79899 Other long term (current) drug therapy: Secondary | ICD-10-CM | POA: Insufficient documentation

## 2015-08-19 DIAGNOSIS — I10 Essential (primary) hypertension: Secondary | ICD-10-CM | POA: Insufficient documentation

## 2015-08-19 DIAGNOSIS — Z8669 Personal history of other diseases of the nervous system and sense organs: Secondary | ICD-10-CM | POA: Insufficient documentation

## 2015-08-19 DIAGNOSIS — Z7982 Long term (current) use of aspirin: Secondary | ICD-10-CM | POA: Insufficient documentation

## 2015-08-19 DIAGNOSIS — L0291 Cutaneous abscess, unspecified: Secondary | ICD-10-CM

## 2015-08-19 DIAGNOSIS — L02215 Cutaneous abscess of perineum: Secondary | ICD-10-CM | POA: Insufficient documentation

## 2015-08-19 HISTORY — DX: Unspecified convulsions: R56.9

## 2015-08-19 MED ORDER — LIDOCAINE HCL 2 % IJ SOLN
20.0000 mL | Freq: Once | INTRAMUSCULAR | Status: AC
  Administered 2015-08-19: 400 mg
  Filled 2015-08-19: qty 20

## 2015-08-19 MED ORDER — IBUPROFEN 800 MG PO TABS: 800.0000 mg | ORAL_TABLET | Freq: Three times a day (TID) | ORAL | Status: DC | PRN

## 2015-08-19 MED ORDER — DOXYCYCLINE HYCLATE 100 MG PO CAPS: 100.0000 mg | ORAL_CAPSULE | Freq: Two times a day (BID) | ORAL | Status: DC

## 2015-08-19 MED ORDER — TRAMADOL HCL 50 MG PO TABS: 50.0000 mg | ORAL_TABLET | Freq: Four times a day (QID) | ORAL | Status: DC | PRN

## 2015-08-19 MED ORDER — FENTANYL CITRATE (PF) 100 MCG/2ML IJ SOLN
100.0000 ug | Freq: Once | INTRAMUSCULAR | Status: AC
  Administered 2015-08-19: 100 ug via INTRAMUSCULAR
  Filled 2015-08-19: qty 2

## 2015-08-19 NOTE — ED Provider Notes (Signed)
CSN: [id number]     Arrival date & time 08/19/15  1027 History   First MD Initiated Contact with Patient 08/19/15 1106     Chief Complaint  Patient presents with  . Abscess     (Consider location/radiation/quality/duration/timing/severity/associated sxs/prior Treatment) HPI Patient presents to the emergency department with an abscess to the perineal region.  The patient states that she shaved on the Fourth of July and noticed several bumps forming and this area became more enlarged than the rest.  She states that she has been using warm compresses on the area and states that the area did start draining last night.  She states there is increased warmth to the region.  She denies fever, nausea, vomiting, lethargy, or syncope Past Medical History  Diagnosis Date  . Hx of appendectomy   . H/O tubal ligation   . Hypertension   . Ovarian cyst rupture   . Seizures (HCC)     has been cleared since 1995   Past Surgical History  Procedure Laterality Date  . Appendectomy    . Tubal ligation     No family history on file. Social History  Substance Use Topics  . Smoking status: Never Smoker   . Smokeless tobacco: None  . Alcohol Use: Yes   OB History    No data available     Review of Systems  All other systems negative except as documented in the HPI. All pertinent positives and negatives as reviewed in the HPI. Allergies  Penicillins  Home Medications   Prior to Admission medications   Medication Sig Start Date End Date Taking? Authorizing Provider  Ascorbic Acid (VITAMIN C PO) Take 2 tablets by mouth daily.   Yes Historical Provider, MD  Aspirin-Acetaminophen (GOODY BODY PAIN) 500-325 MG PACK Take 1 Package by mouth every 6 (six) hours as needed (for pain).   Yes Historical Provider, MD  Cyanocobalamin (B-12 PO) Take 1 tablet by mouth daily.   Yes Historical Provider, MD  diphenhydrAMINE (BENADRYL) 25 mg capsule Take 50 mg by mouth daily as needed for allergies.   Yes  Historical Provider, MD  GARLIC PO Take 1 tablet by mouth daily.   Yes Historical Provider, MD  ibuprofen (ADVIL,MOTRIN) 200 MG tablet Take 400 mg by mouth every 4 (four) hours as needed for fever, headache, mild pain, moderate pain or cramping.   Yes Historical Provider, MD  Omega-3 Fatty Acids (FISH OIL PO) Take 1 tablet by mouth daily.   Yes Historical Provider, MD  VITAMIN E PO Take 3 tablets by mouth daily.   Yes Historical Provider, MD  ciprofloxacin (CIPRO) 500 MG tablet Take 1 tablet (500 mg total) by mouth 2 (two) times daily. Patient not taking: Reported on 08/19/2015 07/31/15   Gerhard Munch, MD  dicyclomine (BENTYL) 20 MG tablet Take 1 tablet (20 mg total) by mouth 2 (two) times daily. Patient not taking: Reported on 08/19/2015 07/31/15   Gerhard Munch, MD  metroNIDAZOLE (FLAGYL) 500 MG tablet Take 1 tablet (500 mg total) by mouth 2 (two) times daily. Patient not taking: Reported on 08/19/2015 07/31/15   Gerhard Munch, MD   BP 172/93 mmHg  Pulse 98  Temp(Src) 98.7 F (37.1 C) (Oral)  Resp 21  SpO2 100%  LMP 07/23/2015 Physical Exam  Constitutional: She is oriented to person, place, and time.  Pulmonary/Chest: Effort normal.  Genitourinary:     Neurological: She is alert and oriented to person, place, and time.  Skin: Skin is warm and dry. No  rash noted. No erythema.    ED Course  Procedures (including critical care time) Labs Review Labs Reviewed - No data to display  Imaging Review No results found. I have personally reviewed and evaluated these images and lab results as part of my medical decision-making.  INCISION AND DRAINAGE Performed by: Carlyle DollyLAWYER,Jerline Linzy W Consent: Verbal consent obtained. Risks and benefits: risks, benefits and alternatives were discussed Type: abscess  Body area: Peroneal region  Anesthesia: local infiltration  Incision was made with a scalpel.  Local anesthetic: lidocaine 2% wo epinephrine  Anesthetic total: 9 ml  Complexity:  complex Blunt dissection to break up loculations  Drainage: purulent  Drainage amount: large  Packing material: 1/4 in iodoform gauze  Patient tolerance: Patient tolerated the procedure well with no immediate complications.  Patient is advised return here in 2 days for recheck.  Patient agrees the plan and all questions were answered.  Advised to keep warm compresses around the area     [facility] LLCChristopher Anne [place], PA-C 08/19/15 1328  Vanetta MuldersScott Zackowski, MD 08/23/15 1345

## 2015-08-19 NOTE — Discharge Instructions (Signed)
Return here in 2 days for recheck.  Keep the area covered, use warm compresses around the area

## 2015-08-19 NOTE — ED Notes (Signed)
Pt reports vaginal abscesses; denies hx of same.

## 2015-08-19 NOTE — ED Notes (Signed)
Bed: WA01 Expected date:  Expected time:  Means of arrival:  Comments: 

## 2015-08-21 ENCOUNTER — Emergency Department (HOSPITAL_COMMUNITY)
Admission: EM | Admit: 2015-08-21 | Discharge: 2015-08-22 | Disposition: A | Discharge status: Home / Self Care | Payer: Self-pay | Attending: Emergency Medicine | Admitting: Emergency Medicine

## 2015-08-21 ENCOUNTER — Encounter (HOSPITAL_COMMUNITY): Payer: Self-pay | Admitting: Emergency Medicine

## 2015-08-21 DIAGNOSIS — L02215 Cutaneous abscess of perineum: Secondary | ICD-10-CM | POA: Insufficient documentation

## 2015-08-21 DIAGNOSIS — F1290 Cannabis use, unspecified, uncomplicated: Secondary | ICD-10-CM | POA: Insufficient documentation

## 2015-08-21 DIAGNOSIS — N760 Acute vaginitis: Secondary | ICD-10-CM | POA: Insufficient documentation

## 2015-08-21 DIAGNOSIS — L0291 Cutaneous abscess, unspecified: Secondary | ICD-10-CM

## 2015-08-21 DIAGNOSIS — L02214 Cutaneous abscess of groin: Secondary | ICD-10-CM | POA: Insufficient documentation

## 2015-08-21 DIAGNOSIS — Z79899 Other long term (current) drug therapy: Secondary | ICD-10-CM | POA: Insufficient documentation

## 2015-08-21 DIAGNOSIS — Z7982 Long term (current) use of aspirin: Secondary | ICD-10-CM | POA: Insufficient documentation

## 2015-08-21 DIAGNOSIS — I10 Essential (primary) hypertension: Secondary | ICD-10-CM | POA: Insufficient documentation

## 2015-08-21 DIAGNOSIS — B9689 Other specified bacterial agents as the cause of diseases classified elsewhere: Secondary | ICD-10-CM

## 2015-08-21 LAB — CBC WITH DIFFERENTIAL/PLATELET
BASOS ABS: 0 10*3/uL (ref 0.0–0.1)
Basophils Relative: 0 %
EOS ABS: 0.2 10*3/uL (ref 0.0–0.7)
EOS PCT: 4 %
HCT: 36.7 % (ref 36.0–46.0)
HEMOGLOBIN: 12 g/dL (ref 12.0–15.0)
LYMPHS PCT: 25 %
Lymphs Abs: 1.5 10*3/uL (ref 0.7–4.0)
MCH: 29.1 pg (ref 26.0–34.0)
MCHC: 32.7 g/dL (ref 30.0–36.0)
MCV: 89.1 fL (ref 78.0–100.0)
Monocytes Absolute: 0.3 10*3/uL (ref 0.1–1.0)
Monocytes Relative: 5 %
NEUTROS PCT: 66 %
Neutro Abs: 3.9 10*3/uL (ref 1.7–7.7)
PLATELETS: 229 10*3/uL (ref 150–400)
RBC: 4.12 MIL/uL (ref 3.87–5.11)
RDW: 12.8 % (ref 11.5–15.5)
WBC: 5.9 10*3/uL (ref 4.0–10.5)

## 2015-08-21 LAB — WET PREP, GENITAL
SPERM: NONE SEEN
TRICH WET PREP: NONE SEEN
YEAST WET PREP: NONE SEEN

## 2015-08-21 MED ORDER — METRONIDAZOLE 500 MG PO TABS: 500.0000 mg | ORAL_TABLET | Freq: Two times a day (BID) | ORAL | Status: DC

## 2015-08-21 MED ORDER — SULFAMETHOXAZOLE-TRIMETHOPRIM 800-160 MG PO TABS: 1.0000 | ORAL_TABLET | Freq: Two times a day (BID) | ORAL | Status: AC

## 2015-08-21 MED ORDER — LIDOCAINE-EPINEPHRINE 2 %-1:100000 IJ SOLN
10.0000 mL | Freq: Once | INTRAMUSCULAR | Status: AC
  Administered 2015-08-21: 10 mL via INTRADERMAL
  Filled 2015-08-21: qty 1

## 2015-08-21 NOTE — Discharge Instructions (Signed)
Abscess °An abscess is an infected area that contains a collection of pus and debris. It can occur in almost any part of the body. An abscess is also known as a furuncle or boil. °CAUSES  °An abscess occurs when tissue gets infected. This can occur from blockage of oil or sweat glands, infection of hair follicles, or a minor injury to the skin. As the body tries to fight the infection, pus collects in the area and creates pressure under the skin. This pressure causes pain. People with weakened immune systems have difficulty fighting infections and get certain abscesses more often.  °SYMPTOMS °Usually an abscess develops on the skin and becomes a painful mass that is red, warm, and tender. If the abscess forms under the skin, you may feel a moveable soft area under the skin. Some abscesses break open (rupture) on their own, but most will continue to get worse without care. The infection can spread deeper into the body and eventually into the bloodstream, causing you to feel ill.  °DIAGNOSIS  °Your caregiver will take your medical history and perform a physical exam. A sample of fluid may also be taken from the abscess to determine what is causing your infection. °TREATMENT  °Your caregiver may prescribe antibiotic medicines to fight the infection. However, taking antibiotics alone usually does not cure an abscess. Your caregiver may need to make a small cut (incision) in the abscess to drain the pus. In some cases, gauze is packed into the abscess to reduce pain and to continue draining the area. °HOME CARE INSTRUCTIONS  °· Only take over-the-counter or prescription medicines for pain, discomfort, or fever as directed by your caregiver. °· If you were prescribed antibiotics, take them as directed. Finish them even if you start to feel better. °· If gauze is used, follow your caregiver's directions for changing the gauze. °· To avoid spreading the infection: °¨ Keep your draining abscess covered with a  bandage. °¨ Wash your hands well. °¨ Do not share personal care items, towels, or whirlpools with others. °¨ Avoid skin contact with others. °· Keep your skin and clothes clean around the abscess. °· Keep all follow-up appointments as directed by your caregiver. °SEEK MEDICAL CARE IF:  °· You have increased pain, swelling, redness, fluid drainage, or bleeding. °· You have muscle aches, chills, or a general ill feeling. °· You have a fever. °MAKE SURE YOU:  °· Understand these instructions. °· Will watch your condition. °· Will get help right away if you are not doing well or get worse. °  °This information is not intended to replace advice given to you by your health care provider. Make sure you discuss any questions you have with your health care provider. °  °Document Released: 11/07/2004 Document Revised: 07/30/2011 Document Reviewed: 04/12/2011 °Elsevier Interactive Patient Education ©2016 Elsevier Inc. ° °Bacterial Vaginosis °Bacterial vaginosis is a vaginal infection that occurs when the normal balance of bacteria in the vagina is disrupted. It results from an overgrowth of certain bacteria. This is the most common vaginal infection in women of childbearing age. Treatment is important to prevent complications, especially in pregnant women, as it can cause a premature delivery. °CAUSES  °Bacterial vaginosis is caused by an increase in harmful bacteria that are normally present in smaller amounts in the vagina. Several different kinds of bacteria can cause bacterial vaginosis. However, the reason that the condition develops is not fully understood. °RISK FACTORS °Certain activities or behaviors can put you at an increased risk of   developing bacterial vaginosis, including: °· Having a new sex partner or multiple sex partners. °· Douching. °· Using an intrauterine device (IUD) for contraception. °Women do not get bacterial vaginosis from toilet seats, bedding, swimming pools, or contact with objects around  them. °SIGNS AND SYMPTOMS  °Some women with bacterial vaginosis have no signs or symptoms. Common symptoms include: °· Grey vaginal discharge. °· A fishlike odor with discharge, especially after sexual intercourse. °· Itching or burning of the vagina and vulva. °· Burning or pain with urination. °DIAGNOSIS  °Your health care provider will take a medical history and examine the vagina for signs of bacterial vaginosis. A sample of vaginal fluid may be taken. Your health care provider will look at this sample under a microscope to check for bacteria and abnormal cells. A vaginal pH test may also be done.  °TREATMENT  °Bacterial vaginosis may be treated with antibiotic medicines. These may be given in the form of a pill or a vaginal cream. A second round of antibiotics may be prescribed if the condition comes back after treatment. Because bacterial vaginosis increases your risk for sexually transmitted diseases, getting treated can help reduce your risk for chlamydia, gonorrhea, HIV, and herpes. °HOME CARE INSTRUCTIONS  °· Only take over-the-counter or prescription medicines as directed by your health care provider. °· If antibiotic medicine was prescribed, take it as directed. Make sure you finish it even if you start to feel better. °· Tell all sexual partners that you have a vaginal infection. They should see their health care provider and be treated if they have problems, such as a mild rash or itching. °· During treatment, it is important that you follow these instructions: °¨ Avoid sexual activity or use condoms correctly. °¨ Do not douche. °¨ Avoid alcohol as directed by your health care provider. °¨ Avoid breastfeeding as directed by your health care provider. °SEEK MEDICAL CARE IF:  °· Your symptoms are not improving after 3 days of treatment. °· You have increased discharge or pain. °· You have a fever. °MAKE SURE YOU:  °· Understand these instructions. °· Will watch your condition. °· Will get help right away  if you are not doing well or get worse. °FOR MORE INFORMATION  °Centers for Disease Control and Prevention, Division of STD Prevention: www.cdc.gov/std °American Sexual Health Association (ASHA): www.ashastd.org  °  °This information is not intended to replace advice given to you by your health care provider. Make sure you discuss any questions you have with your health care provider. °  °Document Released: 01/28/2005 Document Revised: 02/18/2014 Document Reviewed: 09/09/2012 °Elsevier Interactive Patient Education ©2016 Elsevier Inc. ° °
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
## 2015-08-22 LAB — GC/CHLAMYDIA PROBE AMP (~~LOC~~) NOT AT ARMC
Chlamydia: NEGATIVE
NEISSERIA GONORRHEA: NEGATIVE

## 2015-08-22 LAB — HIV ANTIBODY (ROUTINE TESTING W REFLEX): HIV Screen 4th Generation wRfx: NONREACTIVE

## 2015-08-22 LAB — RPR: RPR: NONREACTIVE

## 2015-08-22 MED ORDER — SULFAMETHOXAZOLE-TRIMETHOPRIM 800-160 MG PO TABS
1.0000 | ORAL_TABLET | Freq: Once | ORAL | Status: AC
  Administered 2015-08-22: 1 via ORAL
  Filled 2015-08-22: qty 1

## 2015-08-22 NOTE — ED Provider Notes (Signed)
CSN: [id number]     Arrival date & time 08/21/15  1623 History   First MD Initiated Contact with Patient 08/21/15 2102     Chief Complaint  Patient presents with  . Abscess  . Exposure to STD    HPI Comments: 45 year old female who presents for worsening pain around an abscess and vaginal discharge. She was seen 3 days ago for abscess in the groin. She had I&D of one of the abscesses and was put on antibiotics and given pain medicine. She did not get either filled due to not being able to afford the medication. She also reports the packing fell out. She has been using warm compresses without relief. She denies fever, chills, abdominal pain, N/V/D, irritative voiding symptoms. She does endorse vaginal discharge which is thick and white but not malodorous. She states she has not been sexually active since she broke up with her long term boyfriend in January but she wants to get checked.   Patient is a 45 y.o. female presenting with abscess and STD exposure.  Abscess Associated symptoms: no fever   Exposure to STD Pertinent negatives include no fever.    Past Medical History  Diagnosis Date  . Hx of appendectomy   . H/O tubal ligation   . Hypertension   . Ovarian cyst rupture   . Seizures (HCC)     has been cleared since 1995   Past Surgical History  Procedure Laterality Date  . Appendectomy    . Tubal ligation     No family history on file. Social History  Substance Use Topics  . Smoking status: Never Smoker   . Smokeless tobacco: None  . Alcohol Use: Yes   OB History    No data available     Review of Systems  Constitutional: Negative for fever.  Genitourinary: Positive for vaginal discharge. Negative for dysuria, hematuria and vaginal pain.  Skin:       Abscess      Allergies  Penicillins  Home Medications   Prior to Admission medications   Medication Sig Start Date End Date Taking? Authorizing Provider  Ascorbic Acid (VITAMIN C PO) Take 2 tablets by mouth  daily.   Yes Historical Provider, MD  Aspirin-Acetaminophen (GOODY BODY PAIN) 500-325 MG PACK Take 1 Package by mouth every 6 (six) hours as needed (for pain).   Yes Historical Provider, MD  Cyanocobalamin (B-12 PO) Take 1 tablet by mouth daily.   Yes Historical Provider, MD  diphenhydrAMINE (BENADRYL) 25 mg capsule Take 50 mg by mouth daily as needed for allergies.   Yes Historical Provider, MD  GARLIC PO Take 1 tablet by mouth daily.   Yes Historical Provider, MD  Omega-3 Fatty Acids (FISH OIL PO) Take 1 tablet by mouth daily.   Yes Historical Provider, MD  VITAMIN E PO Take 3 tablets by mouth daily.   Yes Historical Provider, MD  metroNIDAZOLE (FLAGYL) 500 MG tablet Take 1 tablet (500 mg total) by mouth 2 (two) times daily. 08/21/15   Bethel BornKelly Marie Tye Vigo, PA-C  sulfamethoxazole-trimethoprim (BACTRIM DS,SEPTRA DS) 800-160 MG tablet Take 1 tablet by mouth 2 (two) times daily. 08/21/15 08/28/15  Bethel BornKelly Marie Chaniah Cisse, PA-C   BP 180/113 mmHg  Pulse 67  Temp(Src) 99.1 F (37.3 C) (Oral)  Resp 16  Ht 5' 6.5" (1.689 m)  Wt 79.379 kg  BMI 27.83 kg/m2  SpO2 100%  LMP 07/23/2015   Physical Exam  Constitutional: She is oriented to person, place, and time. She  appears well-developed and well-nourished. No distress.  HENT:  Head: Normocephalic and atraumatic.  Eyes: Conjunctivae are normal. Pupils are equal, round, and reactive to light. Right eye exhibits no discharge. Left eye exhibits no discharge. No scleral icterus.  Neck: Normal range of motion.  Cardiovascular: Normal rate.   Pulmonary/Chest: Effort normal. No respiratory distress.  Abdominal: She exhibits no distension.  Genitourinary:  Right sided inguinal lymphadenopathy. No inguinal hernia noted. Normal external genitalia. No pain with speculum insertion. Closed cervical os with normal appearance - no rash or lesions.Moderate amount of thick white discharge. No bleeding noted from cervix or in vaginal vault. On bimanual examination no adnexal  tenderness or cervical motion tenderness. Chaperone present during exam.    Neurological: She is alert and oriented to person, place, and time.  Skin: Skin is warm and dry.  Multiple ingrown hairs over mons with induration and swelling. Only one of the areas is fluctuant and has drainage. Prevously I&D'd abscess is over the right lower labia. Packing has fallen out. Area is indurated and tender to palpation with small amount of drainage.  Psychiatric: She has a normal mood and affect. Her behavior is normal.    ED Course  Procedures (including critical care time) Labs Review Labs Reviewed  WET PREP, GENITAL - Abnormal; Notable for the following:    Clue Cells Wet Prep HPF POC PRESENT (*)    WBC, Wet Prep HPF POC RARE (*)    All other components within normal limits  CBC WITH DIFFERENTIAL/PLATELET  RPR  HIV ANTIBODY (ROUTINE TESTING)  GC/CHLAMYDIA PROBE AMP ([place]) NOT AT [facility]    Imaging Review No results found. I have personally reviewed and evaluated these images and lab results as part of my medical decision-making.   EKG Interpretation None      INCISION AND DRAINAGE Performed by: Bethel Born Consent: Verbal consent obtained. Risks and benefits: risks, benefits and alternatives were discussed Type: abscess  Body area: Right lower groin  Anesthesia: local infiltration  Incision was made with a scalpel.  Local anesthetic: lidocaine 2% with epinephrine  Anesthetic total: 3 ml  Complexity: simple Blunt dissection to break up loculations  Drainage: purulent  Drainage amount: <1cc  Packing material: 1/4 in iodoform gauze  Patient tolerance: Patient tolerated the procedure well with no immediate complications.   INCISION AND DRAINAGE Performed by: Bethel Born Consent: Verbal consent obtained. Risks and benefits: risks, benefits and alternatives were discussed Type: abscess  Body area: central mons pubis  Anesthesia: local  infiltration  Incision was made with a scalpel.  Local anesthetic: lidocaine 2% with epinephrine  Anesthetic total: 3 ml  Complexity: simple Blunt dissection to break up loculations  Drainage: purulent  Drainage amount: <1cc  Packing material: None  Patient tolerance: Patient tolerated the procedure well with no immediate complications.    MDM   Final diagnoses:  Abscess  Bacterial vaginosis   45 year old female who presents with worsening pain from an abscess and vaginal discharge. Abscesses appear to be caused by ingrown hairs. Site was still draining so area was anesthetized, irrigated thouroughly with NS, and packed with iodoform gauze. Another abscess was I&D'd as well due to visible drainage. Packing was not needed since abscess was small. Patient was unable to afford Doxy - will switch to Bactrim and give one dose here. Patient advised on wound care and instructed to return in 3 days for follow up. CBC was unremarkable. Wet prep showed Clue cells - will treat  with Flagyl. Patient instructed to wait until abscess resolves until she takes another antibiotic. STI screening labs sent off. Patient is NAD, non-toxic, with stable VS. Patient is informed of clinical course, understands medical decision making process, and agrees with plan. Opportunity for questions provided and all questions answered. Return precautions given.     Bethel Born, PA-C 08/22/15 0058  Laurence Spates, MD 08/23/15 [phone]

## 2015-08-22 NOTE — ED Notes (Signed)
Patient taken by wheelchair to the car at this time

## 2015-08-24 ENCOUNTER — Encounter (HOSPITAL_COMMUNITY): Payer: Self-pay | Admitting: Emergency Medicine

## 2015-08-24 ENCOUNTER — Emergency Department (HOSPITAL_COMMUNITY)
Admission: EM | Admit: 2015-08-24 | Discharge: 2015-08-24 | Disposition: A | Discharge status: Home / Self Care | Payer: Self-pay | Attending: Emergency Medicine | Admitting: Emergency Medicine

## 2015-08-24 DIAGNOSIS — Z79899 Other long term (current) drug therapy: Secondary | ICD-10-CM | POA: Insufficient documentation

## 2015-08-24 DIAGNOSIS — I10 Essential (primary) hypertension: Secondary | ICD-10-CM | POA: Insufficient documentation

## 2015-08-24 DIAGNOSIS — Z4800 Encounter for change or removal of nonsurgical wound dressing: Secondary | ICD-10-CM | POA: Insufficient documentation

## 2015-08-24 DIAGNOSIS — Z5189 Encounter for other specified aftercare: Secondary | ICD-10-CM

## 2015-08-24 DIAGNOSIS — Z792 Long term (current) use of antibiotics: Secondary | ICD-10-CM | POA: Insufficient documentation

## 2015-08-24 DIAGNOSIS — F1290 Cannabis use, unspecified, uncomplicated: Secondary | ICD-10-CM | POA: Insufficient documentation

## 2015-08-24 NOTE — ED Provider Notes (Signed)
CSN: [id number]     Arrival date & time 08/24/15  1754 History  By signing my name below, I, Tanda RockersMargaux Venter, attest that this documentation has been prepared under the direction and in the presence of Applied MaterialsJessica Belina Mandile, PA-C.  Electronically Signed: Tanda RockersMargaux Venter, ED Scribe. 08/24/2015. 7:32 PM.   No chief complaint on file.  The history is provided by the patient. No language interpreter was used.    HPI Comments: Anita Perkins is a 45 y.o. female who presents to the Emergency Department for wound check of perineal abscess that formed 1 week ago. Pt reports that she was seen in the ED twice for this abscess in the last week. The first time she was seen she had an I&D with packing done. Pt states that the packing fell out and the wound was not healing properly, prompting her to return to the ED. Pt had a second I&D done with packing as well. She states that the packing came out a day after having the I&D done and the wound has been healing well. Denies fever, chills, redness, swelling, or any other associated symptoms.   Per chart review: Pt was seen in the ED on 08/19/2015 (approximately 5 days ago) for an abscess to the perineal region. Pt had an I&D done and had 1/4" iodoform placed. Pt was discharged home with prescriptions for Doxycycline, Tramadol, and Ibuprofen and advised to follow up in 2 days for wound check. She returned to the ED on 08/21/2015 (3 days ago) for worsening pain around the abscess along with vaginal discharge. Pt did not get prescriptions filled due to being unable to afford them. She had an I&D done again with packing and had lab work done including wet prep, CBC, RPR, HIV, and GC Chlamydia. Pt was placed on Bactrim instead and also treated with Flagyl due to findings of clue cells in the wet prep. She was instructed to return in 3 days for a wound check and discharged home.   Past Medical History  Diagnosis Date  . Hx of appendectomy   . H/O tubal ligation   . Hypertension    . Ovarian cyst rupture   . Seizures (HCC)     has been cleared since 1995   Past Surgical History  Procedure Laterality Date  . Appendectomy    . Tubal ligation     History reviewed. No pertinent family history. Social History  Substance Use Topics  . Smoking status: Never Smoker   . Smokeless tobacco: None  . Alcohol Use: Yes   OB History    No data available     Review of Systems  Constitutional: Negative for fever and chills.  Respiratory: Negative for shortness of breath.   Cardiovascular: Negative for chest pain.  Skin: Positive for wound. Negative for color change and rash.   Allergies  Penicillins  Home Medications   Prior to Admission medications   Medication Sig Start Date End Date Taking? Authorizing Provider  Ascorbic Acid (VITAMIN C PO) Take 2 tablets by mouth daily.    Historical Provider, MD  Aspirin-Acetaminophen (GOODY BODY PAIN) 500-325 MG PACK Take 1 Package by mouth every 6 (six) hours as needed (for pain).    Historical Provider, MD  Cyanocobalamin (B-12 PO) Take 1 tablet by mouth daily.    Historical Provider, MD  diphenhydrAMINE (BENADRYL) 25 mg capsule Take 50 mg by mouth daily as needed for allergies.    Historical Provider, MD  GARLIC PO Take 1 tablet by mouth daily.  Historical Provider, MD  metroNIDAZOLE (FLAGYL) 500 MG tablet Take 1 tablet (500 mg total) by mouth 2 (two) times daily. 08/21/15   Bethel Born, PA-C  Omega-3 Fatty Acids (FISH OIL PO) Take 1 tablet by mouth daily.    Historical Provider, MD  sulfamethoxazole-trimethoprim (BACTRIM DS,SEPTRA DS) 800-160 MG tablet Take 1 tablet by mouth 2 (two) times daily. 08/21/15 08/28/15  Bethel Born, PA-C  VITAMIN E PO Take 3 tablets by mouth daily.    Historical Provider, MD   BP 157/100 mmHg  Pulse 66  Temp(Src) 98.8 F (37.1 C) (Oral)  Resp 15  SpO2 100%  LMP 07/23/2015   Physical Exam  Constitutional: She appears well-developed and well-nourished. No distress.  HENT:   Head: Normocephalic and atraumatic.  Eyes: Conjunctivae are normal.  Neck: Normal range of motion.  Pulmonary/Chest: Effort normal.  Genitourinary:  Chaperone present. Well appearing abscess. No surrounding erythema or edema. No increased warmth. No purulent discharge. Wound is closed. Still mild surrounding induration.   Musculoskeletal: Normal range of motion. She exhibits no edema.  Neurological: She is alert. Coordination normal.  Skin: Skin is warm and dry. She is not diaphoretic.  Psychiatric: She has a normal mood and affect. Her behavior is normal.  Nursing note and vitals reviewed.   ED Course  Procedures (including critical care time)  DIAGNOSTIC STUDIES: Oxygen Saturation is 100% on RA, normal by my interpretation.    COORDINATION OF CARE: 7:27 PM-Discussed treatment plan which includes continuation of antibiotics with pt at bedside and pt agreed to plan.   Labs Review Labs Reviewed - No data to display  Imaging Review No results found. I have personally reviewed and evaluated these images and lab results as part of my medical decision-making.   EKG Interpretation None      MDM   Final diagnoses:  Wound check, abscess    Patient returns for check of recent I&D. The region appears to be well-healing and infection appears to be resolving. Patient symptoms improved from prior visit. Afebrile and hemodynamically stable. Pt is instructed to continue with home care and antibiotics. Pt has a good understanding of return precautions and is safe for discharge at this time.  I personally performed the services described in this documentation, which was scribed in my presence. The recorded information has been reviewed and is accurate.        Jerre Simon, PA 08/24/15 1956  Raeford Razor, MD 08/25/15 [phone]

## 2015-08-24 NOTE — Discharge Instructions (Signed)
Your abscess appears to be healing well. Continue to take the antibiotic as prescribed and be sure to complete the full course. Return to the emergency department if your abscess returns, you experience fevers, increased pain, redness, warmth, or foul discharge from the previous abscess site.

## 2015-08-24 NOTE — ED Notes (Addendum)
Pt here for wound recheck in vaginal/thigh area. Pt reports she is feeling better after abx.

## 2015-11-01 ENCOUNTER — Emergency Department (HOSPITAL_COMMUNITY)
Admission: EM | Admit: 2015-11-01 | Discharge: 2015-11-01 | Disposition: A | Discharge status: Home / Self Care | Payer: Self-pay | Attending: Emergency Medicine | Admitting: Emergency Medicine

## 2015-11-01 ENCOUNTER — Encounter (HOSPITAL_COMMUNITY): Payer: Self-pay | Admitting: *Deleted

## 2015-11-01 DIAGNOSIS — I10 Essential (primary) hypertension: Secondary | ICD-10-CM | POA: Insufficient documentation

## 2015-11-01 DIAGNOSIS — L0291 Cutaneous abscess, unspecified: Secondary | ICD-10-CM

## 2015-11-01 DIAGNOSIS — K644 Residual hemorrhoidal skin tags: Secondary | ICD-10-CM | POA: Insufficient documentation

## 2015-11-01 DIAGNOSIS — L0231 Cutaneous abscess of buttock: Secondary | ICD-10-CM | POA: Insufficient documentation

## 2015-11-01 DIAGNOSIS — Z791 Long term (current) use of non-steroidal anti-inflammatories (NSAID): Secondary | ICD-10-CM | POA: Insufficient documentation

## 2015-11-01 DIAGNOSIS — Z79899 Other long term (current) drug therapy: Secondary | ICD-10-CM | POA: Insufficient documentation

## 2015-11-01 MED ORDER — LIDOCAINE-EPINEPHRINE (PF) 2 %-1:200000 IJ SOLN
10.0000 mL | Freq: Once | INTRAMUSCULAR | Status: AC
  Administered 2015-11-01: 10 mL
  Filled 2015-11-01: qty 10

## 2015-11-01 MED ORDER — SULFAMETHOXAZOLE-TRIMETHOPRIM 800-160 MG PO TABS: 1.0000 | ORAL_TABLET | Freq: Two times a day (BID) | ORAL | 0 refills | Status: AC

## 2015-11-01 MED ORDER — IBUPROFEN 800 MG PO TABS
800.0000 mg | ORAL_TABLET | Freq: Once | ORAL | Status: AC
  Administered 2015-11-01: 800 mg via ORAL
  Filled 2015-11-01: qty 1

## 2015-11-01 NOTE — ED Triage Notes (Signed)
Pt reports abscess in her L buttock x 3 days.

## 2015-11-01 NOTE — ED Provider Notes (Signed)
[facility]-EMERGENCY DEPT Provider Note   CSN: [id number] Arrival date & time: 11/01/15  0805     History   Chief Complaint Chief Complaint  Patient presents with  . Abscess    HPI Anita Perkins is a 45 y.o. female.  Patient is a 45 year old female with history of hypertension and seizures who presents the ED with complaint of abscess. Patient reports having a worsening abscess to her left buttocks for the past week. She states she has been using warm compresses at home without relief. Denies fever, abdominal pain, nausea, vomiting, pain with defecation, rectal bleeding, drainage. Patient denies taking any medications at home for her symptoms. She reports having a groin abscess in the past.      Past Medical History:  Diagnosis Date  . H/O tubal ligation   . Hx of appendectomy   . Hypertension   . Ovarian cyst rupture   . Seizures (HCC)    has been cleared since 1995    Patient Active Problem List   Diagnosis Date Noted  . Ruptured ovarian cyst 02/08/2012  . Acute blood loss anemia 02/08/2012  . Hypertension 02/08/2012    Past Surgical History:  Procedure Laterality Date  . APPENDECTOMY    . TUBAL LIGATION      OB History    No data available       Home Medications    Prior to Admission medications   Medication Sig Start Date End Date Taking? Authorizing Provider  diphenhydrAMINE (BENADRYL) 25 mg capsule Take 50 mg by mouth daily as needed for allergies.   Yes Historical Provider, MD  hydrochlorothiazide (HYDRODIURIL) 25 MG tablet Take 25 mg by mouth every morning.   Yes Historical Provider, MD  ibuprofen (ADVIL) 200 MG tablet Take 600-800 mg by mouth every 6 (six) hours as needed for moderate pain.   Yes Historical Provider, MD  Cyanocobalamin (B-12 PO) Take 1 tablet by mouth daily.    Historical Provider, MD  metroNIDAZOLE (FLAGYL) 500 MG tablet Take 1 tablet (500 mg total) by mouth 2 (two) times daily. Patient not taking: Reported on 11/01/2015 08/21/15    Bethel Born, PA-C  sulfamethoxazole-trimethoprim (BACTRIM DS,SEPTRA DS) 800-160 MG tablet Take 1 tablet by mouth 2 (two) times daily. 11/01/15 11/08/15  Barrett Henle, PA-C    Family History No family history on file.  Social History Social History  Substance Use Topics  . Smoking status: Never Smoker  . Smokeless tobacco: Never Used  . Alcohol use Yes     Allergies   Penicillins   Review of Systems Review of Systems  Skin:       abscess     Physical Exam Updated Vital Signs BP 153/93 (BP Location: Right Arm)   Pulse 98   Temp 100 F (37.8 C) (Oral)   Ht 5' 6.5" (1.689 m)   Wt 72.6 kg   LMP 10/31/2015   SpO2 100%   BMI 25.44 kg/m   Physical Exam  Constitutional: She is oriented to person, place, and time. She appears well-developed and well-nourished.  HENT:  Head: Normocephalic and atraumatic.  Eyes: Conjunctivae and EOM are normal. Right eye exhibits no discharge. Left eye exhibits no discharge. No scleral icterus.  Neck: Normal range of motion. Neck supple.  Cardiovascular: Normal rate, regular rhythm, normal heart sounds and intact distal pulses.   Pulmonary/Chest: Effort normal and breath sounds normal. No respiratory distress. She has no wheezes. She has no rales. She exhibits no tenderness.  Abdominal: Soft. Bowel  sounds are normal. She exhibits no distension and no mass. There is no tenderness. There is no rebound and no guarding. No hernia.  Genitourinary: Rectal exam shows external hemorrhoid. Rectal exam shows no internal hemorrhoid, no fissure, no mass, no tenderness and anal tone normal.     Genitourinary Comments: 3x2cm area of induration, erythema and fluctuance noted to left perianal/left inner buttocks region. No drainage. TTP.   No fluctuant mass palpated on internal rectal exam.  Musculoskeletal: Normal range of motion. She exhibits no edema.  Neurological: She is alert and oriented to person, place, and time.  Skin: Skin is  warm and dry.  Nursing note and vitals reviewed.    ED Treatments / Results  Labs (all labs ordered are listed, but only abnormal results are displayed) Labs Reviewed - No data to display  EKG  EKG Interpretation None       Radiology No results found.  Procedures .Marland Kitchen.Incision and Drainage Date/Time: 11/01/2015 9:29 AM Performed by: Barrett HenleNADEAU, Keiara Sneeringer ELIZABETH Authorized by: Barrett HenleNADEAU, Karrie Fluellen ELIZABETH   Consent:    Consent obtained:  Verbal   Consent given by:  Patient Location:    Type:  Abscess   Size:  3x2cm   Location: left buttocks. Pre-procedure details:    Skin preparation:  Betadine Procedure type:    Complexity:  Simple Procedure details:    Incision types:  Single straight   Incision depth:  Dermal   Scalpel blade:  11   Wound management:  Probed and deloculated and irrigated with saline   Drainage:  Purulent   Drainage amount:  Moderate   Wound treatment:  Wound left open   Packing materials:  None Post-procedure details:    Patient tolerance of procedure:  Tolerated well, no immediate complications   (including critical care time)  Medications Ordered in ED Medications  lidocaine-EPINEPHrine (XYLOCAINE W/EPI) 2 %-1:200000 (PF) injection 10 mL (10 mLs Infiltration Given 11/01/15 0853)  ibuprofen (ADVIL,MOTRIN) tablet 800 mg (800 mg Oral Given 11/01/15 0853)    ULTRASOUND LIMITED SOFT TISSUE/ MUSCULOSKELETAL:  Indication: evaluate for superficial abscess Linear probe used to evaluate area of interest in two planes. Findings:  4x5cm area of cobblestoning and multiple areas of fluid noted throughout, consistent with subcutaneous abscess Performed by: myself Images saved electronically   Initial Impression / Assessment and Plan / ED Course  I have reviewed the triage vital signs and the nursing notes.  Pertinent labs & imaging results that were available during my care of the patient were reviewed by me and considered in my medical decision making (see  chart for details).  Clinical Course    Pt with subcutaneous left buttocks/perianal abscess, confirmed by bedside US. No evidence or concern for deep anorectal abscess; I do not feel that any further imaging is warranted at this time. Patient with skin abscess amenable to incision and drainage.  Abscess was not large enough to warrant packing or drain,  wound recheck in 2 days. Encouraged home warm soaks and flushing.  Mild signs of cellulitis is surrounding skin and pt with hx of abscesses.  Will d/c to home with bactrim.    Final Clinical Impressions(s) / ED Diagnoses   Final diagnoses:  Abscess    New Prescriptions New Prescriptions   SULFAMETHOXAZOLE-TRIMETHOPRIM (BACTRIM DS,SEPTRA DS) 800-160 MG TABLET    Take 1 tablet by mouth 2 (two) times daily.     Satira Sarkicole Elizabeth DeanNadeau, [place] JerseyPA-C 11/01/15 [id number]    Gerhard Munchobert Lockwood, MD 11/02/15 1126

## 2015-11-01 NOTE — Discharge Instructions (Signed)
Take all of her antibiotic as prescribed until completed. I recommend continuing to apply warm compresses to the area 3-4 times daily. You may take Tylenol or ibuprofen as prescribed over-the-counter as needed for pain relief. Please follow up with a primary care provider from the Resource Guide provided below in 5-7 days as needed. Please return to the Emergency Department if symptoms worsen or new onset of fever, abdominal pain, rectal bleeding, unable to have a bowel movement due to pain, worsening swelling, redness, warmth, drainage.

## 2016-05-16 ENCOUNTER — Encounter (HOSPITAL_COMMUNITY): Payer: Self-pay | Admitting: Emergency Medicine

## 2016-05-16 ENCOUNTER — Ambulatory Visit (HOSPITAL_COMMUNITY)
Admit: 2016-05-16 | Discharge: 2016-05-16 | Disposition: A | Discharge status: Home / Self Care | Payer: Self-pay | Attending: Emergency Medicine | Admitting: Emergency Medicine

## 2016-05-16 ENCOUNTER — Emergency Department (HOSPITAL_COMMUNITY)
Admission: EM | Admit: 2016-05-16 | Discharge: 2016-05-16 | Disposition: A | Discharge status: Home / Self Care | Payer: Self-pay | Attending: Emergency Medicine | Admitting: Emergency Medicine

## 2016-05-16 ENCOUNTER — Emergency Department (HOSPITAL_COMMUNITY): Payer: Self-pay

## 2016-05-16 DIAGNOSIS — R531 Weakness: Secondary | ICD-10-CM

## 2016-05-16 DIAGNOSIS — E876 Hypokalemia: Secondary | ICD-10-CM | POA: Insufficient documentation

## 2016-05-16 DIAGNOSIS — Z79899 Other long term (current) drug therapy: Secondary | ICD-10-CM | POA: Insufficient documentation

## 2016-05-16 DIAGNOSIS — G8929 Other chronic pain: Secondary | ICD-10-CM | POA: Insufficient documentation

## 2016-05-16 DIAGNOSIS — I10 Essential (primary) hypertension: Secondary | ICD-10-CM | POA: Insufficient documentation

## 2016-05-16 DIAGNOSIS — H538 Other visual disturbances: Secondary | ICD-10-CM | POA: Insufficient documentation

## 2016-05-16 DIAGNOSIS — Z5181 Encounter for therapeutic drug level monitoring: Secondary | ICD-10-CM | POA: Insufficient documentation

## 2016-05-16 DIAGNOSIS — M545 Low back pain: Secondary | ICD-10-CM | POA: Insufficient documentation

## 2016-05-16 DIAGNOSIS — M549 Dorsalgia, unspecified: Secondary | ICD-10-CM

## 2016-05-16 DIAGNOSIS — R29898 Other symptoms and signs involving the musculoskeletal system: Secondary | ICD-10-CM

## 2016-05-16 DIAGNOSIS — R42 Dizziness and giddiness: Secondary | ICD-10-CM

## 2016-05-16 DIAGNOSIS — M6281 Muscle weakness (generalized): Secondary | ICD-10-CM | POA: Insufficient documentation

## 2016-05-16 HISTORY — DX: Alcohol abuse, uncomplicated: F10.10

## 2016-05-16 LAB — CBC WITH DIFFERENTIAL/PLATELET
Basophils Absolute: 0 10*3/uL (ref 0.0–0.1)
Basophils Relative: 0 %
EOS ABS: 0.1 10*3/uL (ref 0.0–0.7)
EOS PCT: 1 %
HCT: 39.1 % (ref 36.0–46.0)
Hemoglobin: 13 g/dL (ref 12.0–15.0)
LYMPHS ABS: 1.9 10*3/uL (ref 0.7–4.0)
Lymphocytes Relative: 23 %
MCH: 29.8 pg (ref 26.0–34.0)
MCHC: 33.2 g/dL (ref 30.0–36.0)
MCV: 89.7 fL (ref 78.0–100.0)
MONO ABS: 0.6 10*3/uL (ref 0.1–1.0)
Monocytes Relative: 7 %
Neutro Abs: 5.8 10*3/uL (ref 1.7–7.7)
Neutrophils Relative %: 69 %
PLATELETS: 199 10*3/uL (ref 150–400)
RBC: 4.36 MIL/uL (ref 3.87–5.11)
RDW: 13.4 % (ref 11.5–15.5)
WBC: 8.5 10*3/uL (ref 4.0–10.5)

## 2016-05-16 LAB — URINALYSIS, ROUTINE W REFLEX MICROSCOPIC
BACTERIA UA: NONE SEEN
Bilirubin Urine: NEGATIVE
Glucose, UA: NEGATIVE mg/dL
Hgb urine dipstick: NEGATIVE
Ketones, ur: 80 mg/dL — AB
NITRITE: NEGATIVE
PROTEIN: 30 mg/dL — AB
SPECIFIC GRAVITY, URINE: 1.026 (ref 1.005–1.030)
pH: 6 (ref 5.0–8.0)

## 2016-05-16 LAB — PROTIME-INR
INR: 1.03
Prothrombin Time: 13.5 seconds (ref 11.4–15.2)

## 2016-05-16 LAB — COMPREHENSIVE METABOLIC PANEL
ALK PHOS: 44 U/L (ref 38–126)
ALT: 23 U/L (ref 14–54)
AST: 35 U/L (ref 15–41)
Albumin: 4.8 g/dL (ref 3.5–5.0)
Anion gap: 9 (ref 5–15)
BILIRUBIN TOTAL: 1.4 mg/dL — AB (ref 0.3–1.2)
BUN: 13 mg/dL (ref 6–20)
CALCIUM: 9.9 mg/dL (ref 8.9–10.3)
CO2: 25 mmol/L (ref 22–32)
CREATININE: 1 mg/dL (ref 0.44–1.00)
Chloride: 105 mmol/L (ref 101–111)
GFR calc non Af Amer: >60 mL/min (ref >60)
Glucose, Bld: 81 mg/dL (ref 65–99)
Potassium: 3 mmol/L — ABNORMAL LOW (ref 3.5–5.1)
SODIUM: 139 mmol/L (ref 135–145)
TOTAL PROTEIN: 8.4 g/dL — AB (ref 6.5–8.1)

## 2016-05-16 LAB — POC URINE PREG, ED: PREG TEST UR: NEGATIVE

## 2016-05-16 MED ORDER — LISINOPRIL 10 MG PO TABS
10.0000 mg | ORAL_TABLET | Freq: Once | ORAL | Status: AC
  Administered 2016-05-16: 10 mg via ORAL
  Filled 2016-05-16: qty 1

## 2016-05-16 MED ORDER — POTASSIUM CHLORIDE CRYS ER 20 MEQ PO TBCR
40.0000 meq | EXTENDED_RELEASE_TABLET | Freq: Once | ORAL | Status: AC
  Administered 2016-05-16: 40 meq via ORAL
  Filled 2016-05-16: qty 2

## 2016-05-16 MED ORDER — LISINOPRIL 10 MG PO TABS: 10.0000 mg | ORAL_TABLET | Freq: Every day | ORAL | 0 refills | Status: DC

## 2016-05-16 MED ORDER — HYDROCHLOROTHIAZIDE 12.5 MG PO CAPS
12.5000 mg | ORAL_CAPSULE | Freq: Once | ORAL | Status: AC
  Administered 2016-05-16: 12.5 mg via ORAL
  Filled 2016-05-16: qty 1

## 2016-05-16 MED ORDER — HYDROCHLOROTHIAZIDE 25 MG PO TABS: 25.0000 mg | ORAL_TABLET | Freq: Every day | ORAL | 0 refills | Status: AC

## 2016-05-16 MED ORDER — POTASSIUM CHLORIDE CRYS ER 20 MEQ PO TBCR: 20.0000 meq | EXTENDED_RELEASE_TABLET | Freq: Every day | ORAL | 0 refills | Status: DC

## 2016-05-16 MED ORDER — CLONIDINE HCL 0.1 MG PO TABS
0.1000 mg | ORAL_TABLET | Freq: Once | ORAL | Status: AC
  Administered 2016-05-16: 0.1 mg via ORAL
  Filled 2016-05-16: qty 1

## 2016-05-16 MED ORDER — HYDROCHLOROTHIAZIDE 25 MG PO TABS
25.0000 mg | ORAL_TABLET | Freq: Every day | ORAL | 0 refills | Status: DC
Start: 2016-05-16 — End: 2016-05-16

## 2016-05-16 NOTE — ED Notes (Signed)
Per Archie Patten, [place] [facility] is unable to perform test ordered-patient will have to be transported to [facility] for MRI-AC notified

## 2016-05-16 NOTE — ED Triage Notes (Signed)
Pt c/o left eye blurry for past 2 weeks, but "a white spot falling from my eye for past week." Pt also adds change in coordination over past week at work. Pt adds BP has been increasing and is extremely stressed. 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 Last drink around 11 last night. Pt c/o left sided flank pain with movement. Non tender to touch.

## 2016-05-16 NOTE — ED Notes (Signed)
Pt returned from MRI. Pt calm and resting quietly.

## 2016-05-16 NOTE — ED Provider Notes (Signed)
[facility]-EMERGENCY DEPT Provider Note   CSN: [id number] Arrival date & time: 05/16/16  1116     History   Chief Complaint Chief Complaint  Patient presents with  . Blurred Vision  . Flank Pain    HPI Anita Perkins is a 46 y.o. female.  HPI  46 year old female presents with multiple complaints. Primary complaint appears to be that she has significant hypertension and is not currently on medicines. Has not taken for several months. She has been having left eye blurry vision for 2 weeks. Over the last 1 week she has noticed a white spot falling down across her left eye. This happens periodically throughout the day. It is not a permanent spot. She wears contacts. She denies any eye pain, foreign body sensation, or discharge. Has chronic frontal headaches that she states seemed to be worse over the last 1 week. She gets headaches like these every few days. These headaches are gradually on and off. There is no headache now. No neck pain or stiffness. No chest pain, short of breath, or abdominal pain. She feels like she has been weak on her left side, especially her left leg over the past 1 week. Feels like she has [place] when walking, unsure if it is because of the leg or not. She felt like her arm was weak and numb during the blood pressure cuff testing but otherwise no weakness in her arm. No slurred speech.   She also complains of left low back pain. She states this is a chronic issue but worse over the last 1 week. There is no radiation down her leg. No abdominal pain. No urinary symptoms. She states she is a chronic alcoholic and drinks several drinks per night. She states she drinks until she falls asleep but cannot give me an exact number.   Past Medical History:  Diagnosis Date  . Alcohol abuse   . H/O tubal ligation   . Hx of appendectomy   . Hypertension   . Ovarian cyst rupture   . Seizures (HCC)    has been cleared since 1995    Patient Active Problem List   Diagnosis  Date Noted  . Ruptured ovarian cyst 02/08/2012  . Acute blood loss anemia 02/08/2012  . Hypertension 02/08/2012    Past Surgical History:  Procedure Laterality Date  . APPENDECTOMY    . TUBAL LIGATION      OB History    No data available       Home Medications    Prior to Admission medications   Medication Sig Start Date End Date Taking? Authorizing Provider  hydrochlorothiazide (HYDRODIURIL) 25 MG tablet Take 1 tablet (25 mg total) by mouth daily. 05/16/16   Pricilla Loveless, MD    Family History No family history on file.  Social History Social History  Substance Use Topics  . Smoking status: Never Smoker  . Smokeless tobacco: Never Used  . Alcohol use Yes     Allergies   Penicillins   Review of Systems Review of Systems  Constitutional: Negative for fever.  Eyes: Positive for visual disturbance. Negative for pain, discharge, redness and itching.  Respiratory: Negative for shortness of breath.   Cardiovascular: Negative for chest pain.  Gastrointestinal: Negative for abdominal pain and vomiting.  Genitourinary: Negative for dysuria and hematuria.  Musculoskeletal: Positive for back pain. Negative for neck pain.  Neurological: Positive for dizziness, weakness and headaches.  All other systems reviewed and are negative.    Physical Exam Updated Vital Signs  BP (!) 179/108 (BP Location: Right Arm)   Pulse 74   Temp 98.8 F (37.1 C) (Oral)   Resp 20   Ht 5' 6.5" (1.689 m)   Wt 150 lb (68 kg)   LMP 04/25/2016   SpO2 100%   BMI 23.85 kg/m   Physical Exam  Constitutional: She is oriented to person, place, and time. She appears well-developed and well-nourished. No distress.  HENT:  Head: Normocephalic and atraumatic.  Right Ear: External ear normal.  Left Ear: External ear normal.  Nose: Nose normal.  Eyes: EOM are normal. Pupils are equal, round, and reactive to light. Right eye exhibits no discharge. Left eye exhibits no discharge.  Externally,  eyes appear normal. Normal visual fields. No conjunctival irriation  Neck: Normal range of motion. Neck supple.  Cardiovascular: Normal rate, regular rhythm and normal heart sounds.   Pulmonary/Chest: Effort normal and breath sounds normal.  Abdominal: Soft. She exhibits no distension. There is no tenderness.  Musculoskeletal:       Cervical back: She exhibits no tenderness and no bony tenderness.       Thoracic back: She exhibits no tenderness and no bony tenderness.       Lumbar back: She exhibits tenderness. She exhibits no bony tenderness.       Back:  Neurological: She is alert and oriented to person, place, and time.  CN 3-12 grossly intact. 5/5 strength in all 4 extremities but left leg is weaker than right. Able to hold up for 10+ seconds but seems difficult for patient. Subjective decreased sensation in LLE diffusely. Normal finger to nose.   Skin: Skin is warm and dry. She is not diaphoretic.  Nursing note and vitals reviewed.    ED Treatments / Results  Labs (all labs ordered are listed, but only abnormal results are displayed) Labs Reviewed  COMPREHENSIVE METABOLIC PANEL - Abnormal; Notable for the following:       Result Value   Potassium 3.0 (*)    Total Protein 8.4 (*)    Total Bilirubin 1.4 (*)    All other components within normal limits  URINALYSIS, ROUTINE W REFLEX MICROSCOPIC - Abnormal; Notable for the following:    APPearance HAZY (*)    Ketones, ur 80 (*)    Protein, ur 30 (*)    Leukocytes, UA TRACE (*)    Squamous Epithelial / LPF 6-30 (*)    All other components within normal limits  PROTIME-INR  CBC WITH DIFFERENTIAL/PLATELET  POC URINE PREG, ED    EKG  EKG Interpretation None       Radiology Ct Head Wo Contrast  Result Date: 05/16/2016 CLINICAL DATA:  Blurry vision in the left thigh for 2 weeks. EXAM: CT HEAD WITHOUT CONTRAST TECHNIQUE: Contiguous axial images were obtained from the base of the skull through the vertex without intravenous  contrast. COMPARISON:  None. FINDINGS: Brain: No evidence of acute infarction, hemorrhage, hydrocephalus, extra-axial collection or mass lesion/mass effect. Vascular: No hyperdense vessel or unexpected calcification. Skull: No osseous abnormality. Sinuses/Orbits: Visualized paranasal sinuses are clear. Visualized mastoid sinuses are clear. Visualized orbits demonstrate no focal abnormality. Other: None IMPRESSION: No acute intracranial pathology. Electronically Signed   By: Elige Ko   On: 05/16/2016 13:10    Procedures Procedures (including critical care time)  Medications Ordered in ED Medications  potassium chloride SA (K-DUR,KLOR-CON) CR tablet 40 mEq (40 mEq Oral Given 05/16/16 1457)     Initial Impression / Assessment and Plan / ED Course  I  have reviewed the triage vital signs and the nursing notes.  Pertinent labs & imaging results that were available during my care of the patient were reviewed by me and considered in my medical decision making (see chart for details).  Clinical Course as of May 16 1620  Thu May 16, 2016  1220 Only objective finding is left leg being a little weaker than right. Will get CT head, but probably will need MRI head and likely L-spine given leg weakness.  [SG]    Clinical Course User Index [SG] Pricilla Loveless, MD    Patient measurements stable in the ED. Visual acuity without significant visual defects. Neuro exam shows very mild left leg weakness but is noticeable compared to the right. Given this, she may have had a stroke within the last few days. Given her left-sided back pain, will also get MRI of her lumbar spine to evaluate for spinal pathology. No bowel or bladder incontinence. After MRI of her brain and L-spine were ordered, I was informed by MRI that they have too many other scans to do today and will not really get to her before closing. However [facility] is open for an MRI at this time. Arrangements will be made for her to be transported to  [facility] for the MRI and then come back to this ER for disposition. Care transferred to Dr. Donnald Garre, dispo per MRI.  Final Clinical Impressions(s) / ED Diagnoses   Final diagnoses:  Left leg weakness  Blurred vision, left eye  Chronic left-sided low back pain, with sciatica presence unspecified    New Prescriptions New Prescriptions   HYDROCHLOROTHIAZIDE (HYDRODIURIL) 25 MG TABLET    Take 1 tablet (25 mg total) by mouth daily.     Pricilla Loveless, MD 05/16/16 [phone]

## 2016-05-16 NOTE — ED Notes (Signed)
Patient ambulated to restroom and states she forgot to void in the cup. States she will try again in a little.

## 2016-05-16 NOTE — ED Notes (Signed)
Patient transported to CT 

## 2016-05-16 NOTE — ED Notes (Signed)
[facility] called for transport to [facility] MRI-states they should arrive for transport around 1700-MD/MRI notified-patient will returning back to [facility] once testing is completed

## 2016-05-16 NOTE — ED Notes (Signed)
Pt given update on status of MRI

## 2016-05-16 NOTE — ED Notes (Signed)
Pt talking with family at bedside.

## 2016-05-16 NOTE — ED Provider Notes (Signed)
MRI shows no acute findings to explain the patient's symptoms. Patient is alert nontoxic and neurologically intact. She has been hypertensive and not been treated for a number of years. She had restarted hydrochlorothiazide but ran out last week. Previously she had been on lisinopril. At this time I will reinstitute the lisinopril as well as hydrochlorothiazide. Patient is mildly hypokalemic she'll be given several doses of potassium for the next couple of days. Patient has follow-up with new PCP on Thursday. She is counseled on signs and symptoms for which return.   Arby Barrette, MD 05/16/16 [phone]

## 2016-12-25 ENCOUNTER — Encounter (HOSPITAL_COMMUNITY): Payer: Self-pay

## 2016-12-25 ENCOUNTER — Other Ambulatory Visit: Payer: Self-pay

## 2016-12-25 ENCOUNTER — Emergency Department (HOSPITAL_COMMUNITY)
Admission: EM | Admit: 2016-12-25 | Discharge: 2016-12-25 | Disposition: A | Discharge status: Home / Self Care | Payer: Self-pay | Attending: Emergency Medicine | Admitting: Emergency Medicine

## 2016-12-25 DIAGNOSIS — Z5321 Procedure and treatment not carried out due to patient leaving prior to being seen by health care provider: Secondary | ICD-10-CM | POA: Insufficient documentation

## 2016-12-25 DIAGNOSIS — Z0283 Encounter for blood-alcohol and blood-drug test: Secondary | ICD-10-CM | POA: Insufficient documentation

## 2016-12-25 NOTE — ED Notes (Signed)
Called pt to be reassessed with no response 2X 

## 2016-12-25 NOTE — ED Triage Notes (Signed)
Per Pt, Pt was sent over by her probation officer to have drug/alcohol assessment. Pt reports last drink was in June. Denies any recent drinking or drug use. Reports HTN and no medications due to not having the money.

## 2017-03-12 ENCOUNTER — Other Ambulatory Visit: Payer: Self-pay

## 2017-03-12 ENCOUNTER — Emergency Department (HOSPITAL_COMMUNITY)
Admission: EM | Admit: 2017-03-12 | Discharge: 2017-03-12 | Disposition: A | Discharge status: Home / Self Care | Payer: Self-pay | Attending: Emergency Medicine | Admitting: Emergency Medicine

## 2017-03-12 ENCOUNTER — Emergency Department (HOSPITAL_COMMUNITY): Payer: Self-pay

## 2017-03-12 ENCOUNTER — Encounter (HOSPITAL_COMMUNITY): Payer: Self-pay | Admitting: *Deleted

## 2017-03-12 DIAGNOSIS — Y929 Unspecified place or not applicable: Secondary | ICD-10-CM | POA: Insufficient documentation

## 2017-03-12 DIAGNOSIS — Y999 Unspecified external cause status: Secondary | ICD-10-CM | POA: Insufficient documentation

## 2017-03-12 DIAGNOSIS — I10 Essential (primary) hypertension: Secondary | ICD-10-CM | POA: Insufficient documentation

## 2017-03-12 DIAGNOSIS — Z79899 Other long term (current) drug therapy: Secondary | ICD-10-CM | POA: Insufficient documentation

## 2017-03-12 DIAGNOSIS — Y939 Activity, unspecified: Secondary | ICD-10-CM | POA: Insufficient documentation

## 2017-03-12 DIAGNOSIS — W010XXA Fall on same level from slipping, tripping and stumbling without subsequent striking against object, initial encounter: Secondary | ICD-10-CM | POA: Insufficient documentation

## 2017-03-12 DIAGNOSIS — S0240FA Zygomatic fracture, left side, initial encounter for closed fracture: Secondary | ICD-10-CM | POA: Insufficient documentation

## 2017-03-12 MED ORDER — IBUPROFEN 800 MG PO TABS
800.0000 mg | ORAL_TABLET | Freq: Once | ORAL | Status: AC
  Administered 2017-03-12: 800 mg via ORAL
  Filled 2017-03-12: qty 1

## 2017-03-12 MED ORDER — IBUPROFEN 800 MG PO TABS: 800.0000 mg | ORAL_TABLET | Freq: Three times a day (TID) | ORAL | 0 refills | Status: DC

## 2017-03-12 MED ORDER — HYDROCODONE-ACETAMINOPHEN 5-325 MG PO TABS: 1.0000 | ORAL_TABLET | Freq: Four times a day (QID) | ORAL | 0 refills | Status: DC | PRN

## 2017-03-12 NOTE — ED Notes (Signed)
Pt reports that she "had some drinks and was moving furniture and hit my face on something when I fell and now I have a dent and that is not normal". Swelling and indention noted to left face, no bruising or abrasion.

## 2017-03-12 NOTE — ED Provider Notes (Signed)
[facility] EMERGENCY DEPARTMENT Provider Note   CSN: [id number] Arrival date & time: 03/12/17  1745     History   Chief Complaint Chief Complaint  Patient presents with  . Fall  . Facial Pain    HPI Anita Perkins is a 47 y.o. female presenting for evaluation of left-sided facial pain.  Patient states that around 1:00 this morning, she was drinking alcohol, carrying a box, and fell.  She landed on the left side of her face.  Since then, she has had pain and swelling of the left side of her face.  She reports she feels an indentation.  She has tried ice and Tylenol without improvement of symptoms.  She states it is hard for her to open her mouth, as this causes pain.  She denies loss of consciousness.  She denies neck or back pain.  She denies epistaxis or bleeding from the ears.  She denies pain elsewhere. She denies vision changes, slurred speech, decreased concentration, nausea, vomiting.  She is not on blood thinners.  She ate a sandwich this afternoon around 1:00.  HPI  Past Medical History:  Diagnosis Date  . Alcohol abuse   . H/O tubal ligation   . Hx of appendectomy   . Hypertension   . Ovarian cyst rupture   . Seizures (HCC)    has been cleared since 1995    Patient Active Problem List   Diagnosis Date Noted  . Ruptured ovarian cyst 02/08/2012  . Acute blood loss anemia 02/08/2012  . Hypertension 02/08/2012    Past Surgical History:  Procedure Laterality Date  . APPENDECTOMY    . TUBAL LIGATION      OB History    No data available       Home Medications    Prior to Admission medications   Medication Sig Start Date End Date Taking? Authorizing Provider  hydrochlorothiazide (HYDRODIURIL) 25 MG tablet Take 1 tablet (25 mg total) by mouth daily. 05/16/16   Pricilla Loveless, MD  HYDROcodone-acetaminophen (NORCO/VICODIN) 5-325 MG tablet Take 1 tablet by mouth every 6 (six) hours as needed. 03/12/17   Arelis Neumeier, PA-C  ibuprofen  (ADVIL,MOTRIN) 800 MG tablet Take 1 tablet (800 mg total) by mouth 3 (three) times daily with meals. 03/12/17   Dayanira Giovannetti, PA-C  lisinopril (PRINIVIL,ZESTRIL) 10 MG tablet Take 1 tablet (10 mg total) by mouth daily. 05/16/16   Arby Barrette, MD  potassium chloride SA (K-DUR,KLOR-CON) 20 MEQ tablet Take 1 tablet (20 mEq total) by mouth daily. 05/16/16   Arby Barrette, MD    Family History History reviewed. No pertinent family history.  Social History Social History   Tobacco Use  . Smoking status: Never Smoker  . Smokeless tobacco: Never Used  Substance Use Topics  . Alcohol use: No    Frequency: Never  . Drug use: No    Comment: former marijuana user     Allergies   Penicillins   Review of Systems Review of Systems  Constitutional: Negative for chills and fever.  HENT: Positive for facial swelling. Negative for ear pain, nosebleeds and trouble swallowing.   Eyes: Negative for pain and visual disturbance.  Respiratory: Negative for cough and shortness of breath.   Cardiovascular: Negative for chest pain.  Gastrointestinal: Negative for abdominal pain, nausea and vomiting.  Genitourinary: Negative for dysuria and frequency.  Musculoskeletal: Negative for back pain and neck pain.  Skin: Negative for wound.  Neurological: Negative for dizziness, speech difficulty, numbness and headaches.  Hematological: Does not bruise/bleed easily.  Psychiatric/Behavioral: Negative for confusion.     Physical Exam Updated Vital Signs BP 130/81 (BP Location: Right Arm)   Pulse 66   Temp 98.1 F (36.7 C) (Oral)   Resp 18   SpO2 98%   Physical Exam  Constitutional: She is oriented to person, place, and time. She appears well-developed and well-nourished. No distress.  HENT:  Head: Normocephalic.  Right Ear: Tympanic membrane, external ear and ear canal normal.  Left Ear: Tympanic membrane, external ear and ear canal normal.  Nose: Nose normal.  Mouth/Throat: Uvula is  midline, oropharynx is clear and moist and mucous membranes are normal.  Mild swelling of the left sided face and cheek.  No open laceration.  Tenderness to palpation of the left zygoma and forehead.  Tenderness to palpation of left jaw.  Patient will not open her mouth fully due to pain.  No tenderness palpation of the right side.  No tenderness to palpation of the bridge of the nose.  Eyes: Conjunctivae and EOM are normal. Pupils are equal, round, and reactive to light.  EOMI without pain. PERRLA.  Neck: Normal range of motion. Neck supple.  Full ROM of head and neck without pain. No TTP of midline c-spine   Cardiovascular: Normal rate, regular rhythm and intact distal pulses.  Pulmonary/Chest: Effort normal and breath sounds normal. She exhibits no tenderness.  No TTP of the chest wall  Abdominal: Soft. She exhibits no distension. There is no tenderness.  No TTP of the abd.   Musculoskeletal: Normal range of motion. She exhibits no tenderness.  Strength intact x4.  Sensation intact x4.  Radial and pedal pulses equal bilaterally.  Patient is ambulatory without difficulty.  Neurological: She is alert and oriented to person, place, and time. She has normal strength. No cranial nerve deficit or sensory deficit. GCS eye subscore is 4. GCS verbal subscore is 5. GCS motor subscore is 6.  Fine movement and coordination intact  Skin: Skin is warm.  Psychiatric: She has a normal mood and affect.  Nursing note and vitals reviewed.    ED Treatments / Results  Labs (all labs ordered are listed, but only abnormal results are displayed) Labs Reviewed - No data to display  EKG  EKG Interpretation None       Radiology Ct Head Wo Contrast  Result Date: 03/12/2017 CLINICAL DATA:  Larey Seat face forward to the floor last night with left-sided facial pain and swelling. Unable to open mouth completely. EXAM: CT HEAD WITHOUT CONTRAST CT MAXILLOFACIAL WITHOUT CONTRAST TECHNIQUE: Multidetector CT imaging of  the head and maxillofacial structures were performed using the standard protocol without intravenous contrast. Multiplanar CT image reconstructions of the maxillofacial structures were also generated. COMPARISON:  05/16/2016 FINDINGS: CT HEAD FINDINGS Brain: The brain shows a normal appearance without evidence of malformation, atrophy, old or acute small or large vessel infarction, mass lesion, hemorrhage, hydrocephalus or extra-axial collection. Vascular: No hyperdense vessel. No evidence of atherosclerotic calcification. Skull: Normal.  No traumatic finding.  No focal bone lesion. Sinuses/Orbits: Sinuses are clear. Orbits appear normal. Mastoids are clear. Other: Empty sella as seen previously. CT MAXILLOFACIAL FINDINGS Osseous: Segmental zygomatic arch fracture on the left. This bows in word 1 cm and probably encroaches upon the muscles of mastication. No other facial fracture, including no mandibular fracture or evidence of temporomandibular disruption. Orbits: Normal Sinuses: Clear/normal Soft tissues: Otherwise negative IMPRESSION: Isolated segmental fracture of the zygomatic arch on the left, bowing inward 1 cm,  probably encroaching upon the muscles of mastication. No other facial fracture. No intracranial abnormality. Electronically Signed   By: Paulina Fusi M.D.   On: 03/12/2017 19:43   Ct Maxillofacial Wo Contrast  Result Date: 03/12/2017 CLINICAL DATA:  Larey Seat face forward to the floor last night with left-sided facial pain and swelling. Unable to open mouth completely. EXAM: CT HEAD WITHOUT CONTRAST CT MAXILLOFACIAL WITHOUT CONTRAST TECHNIQUE: Multidetector CT imaging of the head and maxillofacial structures were performed using the standard protocol without intravenous contrast. Multiplanar CT image reconstructions of the maxillofacial structures were also generated. COMPARISON:  05/16/2016 FINDINGS: CT HEAD FINDINGS Brain: The brain shows a normal appearance without evidence of malformation, atrophy,  old or acute small or large vessel infarction, mass lesion, hemorrhage, hydrocephalus or extra-axial collection. Vascular: No hyperdense vessel. No evidence of atherosclerotic calcification. Skull: Normal.  No traumatic finding.  No focal bone lesion. Sinuses/Orbits: Sinuses are clear. Orbits appear normal. Mastoids are clear. Other: Empty sella as seen previously. CT MAXILLOFACIAL FINDINGS Osseous: Segmental zygomatic arch fracture on the left. This bows in word 1 cm and probably encroaches upon the muscles of mastication. No other facial fracture, including no mandibular fracture or evidence of temporomandibular disruption. Orbits: Normal Sinuses: Clear/normal Soft tissues: Otherwise negative IMPRESSION: Isolated segmental fracture of the zygomatic arch on the left, bowing inward 1 cm, probably encroaching upon the muscles of mastication. No other facial fracture. No intracranial abnormality. Electronically Signed   By: Paulina Fusi M.D.   On: 03/12/2017 19:43    Procedures Procedures (including critical care time)  Medications Ordered in ED Medications  ibuprofen (ADVIL,MOTRIN) tablet 800 mg (800 mg Oral Given 03/12/17 2036)     Initial Impression / Assessment and Plan / ED Course  I have reviewed the triage vital signs and the nursing notes.  Pertinent labs & imaging results that were available during my care of the patient were reviewed by me and considered in my medical decision making (see chart for details).     Patient presenting for evaluation of facial pain and swelling after a fall.  Physical exam reassuring, no gross neurologic deficits.  Patient with pain of the left cheek.  No pain with movement of the eyes or visual changes.  Will obtain CT maxillofacial and head for further evaluation.  Ibuprofen given for pain.  CT shows isolated segmental fracture of the left zygoma bowing in approximately 1 cm.  No other injury noted.  No intra-cranial bleed.  Discussed findings with  attending, Dr. Madilyn Hook agrees to plan.  Consulted ENT.  Discussed with Dr. Annalee Genta from ENT.  Recommends follow-up in 4-5 days for reevaluation.  Soft food and liquid diet.  Pain control, ice, and head elevation.  Discussed with patient.  PMP database checked, patient without recent narcotic prescriptions.  Will prescribe scheduled NSAIDs and as needed Norco.  At this time, patient appears safe for discharge.  Return precautions given.  Patient states she understands and agrees to plan.   Final Clinical Impressions(s) / ED Diagnoses   Final diagnoses:  Closed fracture of left zygomatic arch, initial encounter [facility])    ED Discharge Orders        Ordered    ibuprofen (ADVIL,MOTRIN) 800 MG tablet  3 times daily with meals     03/12/17 2017    HYDROcodone-acetaminophen (NORCO/VICODIN) 5-325 MG tablet  Every 6 hours PRN     03/12/17 2017       Alveria Apley, PA-C 03/13/17 0130    Tilden Fossa,  MD 03/13/17 1454

## 2017-03-12 NOTE — ED Notes (Signed)
Patient transported to CT 

## 2017-03-12 NOTE — ED Triage Notes (Signed)
Pt reports having an accident around 0100 this am, had a fall when carrying heavy object. No loc. Has pain and mild bruising to left side of face. No acute distress is noted at triage.

## 2017-03-12 NOTE — Discharge Instructions (Signed)
Take ibuprofen 3 times a day with meals.  Do not take other anti-inflammatories at the same time open (Advil, Motrin, naproxen, Aleve).  You may take norco as needed for severe or breakthrough pain.  Use ice packs for 20 minutes, 3 times a day, to help with pain and swelling. Eat a liquid/soft food diet.  Keep your head elevated. Follow up with the ENT doctor in 5 days for further evaluation. Return to the ER if you develop vision changes, inability to move your eyes, difficulty breathing, or any new or worsening symptoms.

## 2017-03-12 NOTE — ED Notes (Signed)
ED Provider at bedside. 

## 2017-03-20 ENCOUNTER — Other Ambulatory Visit: Payer: Self-pay

## 2017-03-20 ENCOUNTER — Encounter (HOSPITAL_COMMUNITY): Payer: Self-pay | Admitting: *Deleted

## 2017-03-20 NOTE — Progress Notes (Signed)
Pt denies SOB, chest pain, and being under the care of a cardiologist. Pt denies having a stress test, echo and cardiac cath. Pt denies having an EKG and chest x ray within the last year. Pt denies recent labs. Pt stated that she does not take Aspirin. Pt made aware to stop taking vitamins, fish oil, Biotin (hair/skin/nails) and herbal medications. Do not take any NSAIDs ie: Ibuprofen, Advil, Naproxen (Aleve), Motrin, BC and Goody Powder. Pt verbalized understanding of all pre-op instructions.

## 2017-03-21 ENCOUNTER — Encounter (HOSPITAL_COMMUNITY): Payer: Self-pay | Admitting: *Deleted

## 2017-03-21 ENCOUNTER — Encounter (HOSPITAL_COMMUNITY)
Admission: RE | Disposition: A | Discharge status: Home / Self Care | Payer: Self-pay | Source: Ambulatory Visit | Attending: Otolaryngology

## 2017-03-21 ENCOUNTER — Ambulatory Visit (HOSPITAL_COMMUNITY)
Admission: RE | Admit: 2017-03-21 | Discharge: 2017-03-21 | Disposition: A | Discharge status: Home / Self Care | Payer: Self-pay | Source: Ambulatory Visit | Attending: Otolaryngology | Admitting: Otolaryngology

## 2017-03-21 ENCOUNTER — Ambulatory Visit (HOSPITAL_COMMUNITY): Payer: Self-pay | Admitting: Anesthesiology

## 2017-03-21 DIAGNOSIS — K219 Gastro-esophageal reflux disease without esophagitis: Secondary | ICD-10-CM | POA: Insufficient documentation

## 2017-03-21 DIAGNOSIS — W19XXXA Unspecified fall, initial encounter: Secondary | ICD-10-CM | POA: Insufficient documentation

## 2017-03-21 DIAGNOSIS — Y9301 Activity, walking, marching and hiking: Secondary | ICD-10-CM | POA: Insufficient documentation

## 2017-03-21 DIAGNOSIS — S0240FA Zygomatic fracture, left side, initial encounter for closed fracture: Secondary | ICD-10-CM | POA: Insufficient documentation

## 2017-03-21 DIAGNOSIS — I10 Essential (primary) hypertension: Secondary | ICD-10-CM | POA: Insufficient documentation

## 2017-03-21 DIAGNOSIS — Z79899 Other long term (current) drug therapy: Secondary | ICD-10-CM | POA: Insufficient documentation

## 2017-03-21 HISTORY — DX: Gastro-esophageal reflux disease without esophagitis: K21.9

## 2017-03-21 HISTORY — DX: Headache: R51

## 2017-03-21 HISTORY — DX: Headache, unspecified: R51.9

## 2017-03-21 HISTORY — PX: ORIF ZYGOMATIC FRACTURE: SHX2134

## 2017-03-21 HISTORY — DX: Zygomatic fracture, left side, initial encounter for closed fracture: S02.40FA

## 2017-03-21 HISTORY — DX: Major depressive disorder, single episode, unspecified: F32.9

## 2017-03-21 HISTORY — DX: Unspecified osteoarthritis, unspecified site: M19.90

## 2017-03-21 HISTORY — DX: Depression, unspecified: F32.A

## 2017-03-21 LAB — COMPREHENSIVE METABOLIC PANEL
ALT: 13 U/L — AB (ref 14–54)
ANION GAP: 10 (ref 5–15)
AST: 22 U/L (ref 15–41)
Albumin: 3.6 g/dL (ref 3.5–5.0)
Alkaline Phosphatase: 31 U/L — ABNORMAL LOW (ref 38–126)
BUN: 21 mg/dL — ABNORMAL HIGH (ref 6–20)
CHLORIDE: 108 mmol/L (ref 101–111)
CO2: 21 mmol/L — AB (ref 22–32)
Calcium: 8.9 mg/dL (ref 8.9–10.3)
Creatinine, Ser: 1.01 mg/dL — ABNORMAL HIGH (ref 0.44–1.00)
GFR calc non Af Amer: >60 mL/min (ref >60)
Glucose, Bld: 77 mg/dL (ref 65–99)
Potassium: 4.2 mmol/L (ref 3.5–5.1)
SODIUM: 139 mmol/L (ref 135–145)
Total Bilirubin: 0.7 mg/dL (ref 0.3–1.2)
Total Protein: 6.7 g/dL (ref 6.5–8.1)

## 2017-03-21 LAB — CBC
HEMATOCRIT: 37.1 % (ref 36.0–46.0)
HEMOGLOBIN: 11.9 g/dL — AB (ref 12.0–15.0)
MCH: 29.5 pg (ref 26.0–34.0)
MCHC: 32.1 g/dL (ref 30.0–36.0)
MCV: 91.8 fL (ref 78.0–100.0)
Platelets: 185 10*3/uL (ref 150–400)
RBC: 4.04 MIL/uL (ref 3.87–5.11)
RDW: 13.2 % (ref 11.5–15.5)
WBC: 5.1 10*3/uL (ref 4.0–10.5)

## 2017-03-21 LAB — POCT PREGNANCY, URINE: Preg Test, Ur: NEGATIVE

## 2017-03-21 SURGERY — OPEN REDUCTION INTERNAL FIXATION (ORIF) ZYGOMATIC FRACTURE
Anesthesia: General | Laterality: Left

## 2017-03-21 MED ORDER — PROPOFOL 1000 MG/100ML IV EMUL
INTRAVENOUS | Status: AC
  Filled 2017-03-21: qty 100

## 2017-03-21 MED ORDER — CEFAZOLIN SODIUM-DEXTROSE 1-4 GM/50ML-% IV SOLN
INTRAVENOUS | Status: DC | PRN
  Administered 2017-03-21: 1 g via INTRAVENOUS

## 2017-03-21 MED ORDER — LACTATED RINGERS IV SOLN
INTRAVENOUS | Status: DC
  Administered 2017-03-21 (×2): via INTRAVENOUS

## 2017-03-21 MED ORDER — PROPOFOL 10 MG/ML IV BOLUS
INTRAVENOUS | Status: DC | PRN
  Administered 2017-03-21: 170 mg via INTRAVENOUS

## 2017-03-21 MED ORDER — LIDOCAINE-EPINEPHRINE 1 %-1:100000 IJ SOLN
INTRAMUSCULAR | Status: AC
  Filled 2017-03-21: qty 1

## 2017-03-21 MED ORDER — PROMETHAZINE HCL 25 MG/ML IJ SOLN: 6.2500 mg | INTRAMUSCULAR | Status: DC | PRN

## 2017-03-21 MED ORDER — CHLORHEXIDINE GLUCONATE 0.12 % MT SOLN: 10.0000 mL | Freq: Three times a day (TID) | OROMUCOSAL | 0 refills | Status: AC

## 2017-03-21 MED ORDER — HYDROCODONE-ACETAMINOPHEN 7.5-325 MG PO TABS
ORAL_TABLET | ORAL | Status: AC
  Filled 2017-03-21: qty 1

## 2017-03-21 MED ORDER — MIDAZOLAM HCL 5 MG/5ML IJ SOLN
INTRAMUSCULAR | Status: DC | PRN
  Administered 2017-03-21: 2 mg via INTRAVENOUS

## 2017-03-21 MED ORDER — HYDROCODONE-ACETAMINOPHEN 5-325 MG PO TABS: 1.0000 | ORAL_TABLET | ORAL | 0 refills | Status: AC | PRN

## 2017-03-21 MED ORDER — DEXAMETHASONE SODIUM PHOSPHATE 10 MG/ML IJ SOLN
10.0000 mg | Freq: Once | INTRAMUSCULAR | Status: AC
  Administered 2017-03-21: 10 mg via INTRAVENOUS
  Filled 2017-03-21: qty 1

## 2017-03-21 MED ORDER — PROPOFOL 10 MG/ML IV BOLUS
INTRAVENOUS | Status: AC
  Filled 2017-03-21: qty 20

## 2017-03-21 MED ORDER — HYDROMORPHONE HCL 1 MG/ML IJ SOLN
INTRAMUSCULAR | Status: AC
  Filled 2017-03-21: qty 1

## 2017-03-21 MED ORDER — FENTANYL CITRATE (PF) 250 MCG/5ML IJ SOLN
INTRAMUSCULAR | Status: AC
  Filled 2017-03-21: qty 5

## 2017-03-21 MED ORDER — MIDAZOLAM HCL 2 MG/2ML IJ SOLN
INTRAMUSCULAR | Status: AC
  Filled 2017-03-21: qty 2

## 2017-03-21 MED ORDER — HYDROMORPHONE HCL 1 MG/ML IJ SOLN
0.2500 mg | INTRAMUSCULAR | Status: DC | PRN
  Administered 2017-03-21 (×2): 0.5 mg via INTRAVENOUS

## 2017-03-21 MED ORDER — BACITRACIN ZINC 500 UNIT/GM EX OINT
TOPICAL_OINTMENT | CUTANEOUS | Status: AC
  Filled 2017-03-21: qty 28.35

## 2017-03-21 MED ORDER — ONDANSETRON HCL 4 MG/2ML IJ SOLN
INTRAMUSCULAR | Status: DC | PRN
  Administered 2017-03-21: 4 mg via INTRAVENOUS

## 2017-03-21 MED ORDER — MEPERIDINE HCL 50 MG/ML IJ SOLN: 6.2500 mg | INTRAMUSCULAR | Status: DC | PRN

## 2017-03-21 MED ORDER — HYDROCODONE-ACETAMINOPHEN 7.5-325 MG PO TABS
1.0000 | ORAL_TABLET | Freq: Once | ORAL | Status: AC | PRN
Start: 2017-03-21 — End: 2017-03-21
  Administered 2017-03-21: 1 via ORAL

## 2017-03-21 MED ORDER — ACETAMINOPHEN 10 MG/ML IV SOLN: 1000.0000 mg | Freq: Once | INTRAVENOUS | Status: DC | PRN

## 2017-03-21 MED ORDER — FENTANYL CITRATE (PF) 100 MCG/2ML IJ SOLN
INTRAMUSCULAR | Status: DC | PRN
  Administered 2017-03-21 (×2): 50 ug via INTRAVENOUS
  Administered 2017-03-21: 100 ug via INTRAVENOUS

## 2017-03-21 MED ORDER — 0.9 % SODIUM CHLORIDE (POUR BTL) OPTIME
TOPICAL | Status: DC | PRN
  Administered 2017-03-21: 1000 mL

## 2017-03-21 MED ORDER — LIDOCAINE-EPINEPHRINE 1 %-1:100000 IJ SOLN
INTRAMUSCULAR | Status: DC | PRN
  Administered 2017-03-21: 5 mL

## 2017-03-21 MED ORDER — SUCCINYLCHOLINE CHLORIDE 20 MG/ML IJ SOLN
INTRAMUSCULAR | Status: DC | PRN
  Administered 2017-03-21: 100 mg via INTRAVENOUS

## 2017-03-21 SURGICAL SUPPLY — 41 items
BLADE SURG 10 STRL SS (BLADE) ×1 IMPLANT
BLADE SURG 15 STRL LF DISP TIS (BLADE) IMPLANT
BLADE SURG 15 STRL SS (BLADE) ×2
CANISTER SUCT 3000ML PPV (MISCELLANEOUS) ×2 IMPLANT
CLEANER TIP ELECTROSURG 2X2 (MISCELLANEOUS) ×2 IMPLANT
CONFORMERS SILICONE 5649 (OPHTHALMIC RELATED) IMPLANT
COVER SURGICAL LIGHT HANDLE (MISCELLANEOUS) ×2 IMPLANT
CRADLE DONUT ADULT HEAD (MISCELLANEOUS) IMPLANT
DECANTER SPIKE VIAL GLASS SM (MISCELLANEOUS) ×2 IMPLANT
DRAPE HALF SHEET 40X57 (DRAPES) IMPLANT
ELECT COATED BLADE 2.86 ST (ELECTRODE) ×2 IMPLANT
ELECT NDL BLADE 2-5/6 (NEEDLE) IMPLANT
ELECT NEEDLE BLADE 2-5/6 (NEEDLE) IMPLANT
ELECT REM PT RETURN 9FT ADLT (ELECTROSURGICAL) ×2
ELECTRODE REM PT RTRN 9FT ADLT (ELECTROSURGICAL) ×1 IMPLANT
GLOVE ECLIPSE 7.5 STRL STRAW (GLOVE) ×2 IMPLANT
GOWN STRL REUS W/ TWL LRG LVL3 (GOWN DISPOSABLE) ×2 IMPLANT
GOWN STRL REUS W/TWL LRG LVL3 (GOWN DISPOSABLE) ×4
KIT BASIN OR (CUSTOM PROCEDURE TRAY) ×2 IMPLANT
KIT ROOM TURNOVER OR (KITS) ×2 IMPLANT
NDL HYPO 25GX1X1/2 BEV (NEEDLE) IMPLANT
NEEDLE HYPO 25GX1X1/2 BEV (NEEDLE) ×2 IMPLANT
NS IRRIG 1000ML POUR BTL (IV SOLUTION) ×2 IMPLANT
PAD ARMBOARD 7.5X6 YLW CONV (MISCELLANEOUS) ×4 IMPLANT
PATTIES SURGICAL .5 X3 (DISPOSABLE) IMPLANT
PENCIL FOOT CONTROL (ELECTRODE) ×2 IMPLANT
SUT CHROMIC 3 0 PS 2 (SUTURE) IMPLANT
SUT CHROMIC 5 0 P 3 (SUTURE) ×1 IMPLANT
SUT ETHILON 5 0 P 3 18 (SUTURE)
SUT ETHILON 6 0 P 1 (SUTURE) ×2 IMPLANT
SUT NOVAFIL 6 0 PRE 2 4412 13 (SUTURE) ×1 IMPLANT
SUT NYLON ETHILON 5-0 P-3 1X18 (SUTURE) ×1 IMPLANT
SUT SILK 2 0 PERMA HAND 18 BK (SUTURE) ×1 IMPLANT
SUT STEEL 0 (SUTURE)
SUT STEEL 0 18XMFL TIE 17 (SUTURE) IMPLANT
SUT STEEL 2 (SUTURE) IMPLANT
SUT VIC AB 3-0 SH 27 (SUTURE) ×2
SUT VIC AB 3-0 SH 27X BRD (SUTURE) IMPLANT
TOWEL OR 17X24 6PK STRL BLUE (TOWEL DISPOSABLE) ×2 IMPLANT
TRAY ENT MC OR (CUSTOM PROCEDURE TRAY) ×2 IMPLANT
WATER STERILE IRR 1000ML POUR (IV SOLUTION) IMPLANT

## 2017-03-21 NOTE — Anesthesia Preprocedure Evaluation (Addendum)
Anesthesia Evaluation  Patient identified by MRN, date of birth, ID band Patient awake    Reviewed: Allergy & Precautions, NPO status , Patient's Chart, lab work & pertinent test results  Airway Mallampati: II  TM Distance: >3 FB Neck ROM: Full    Dental no notable dental hx. (+) Dental Advidsory Given, Chipped   Pulmonary neg pulmonary ROS,    Pulmonary exam normal breath sounds clear to auscultation       Cardiovascular hypertension, negative cardio ROS Normal cardiovascular exam Rhythm:Regular Rate:Normal     Neuro/Psych  Headaches, PSYCHIATRIC DISORDERS negative neurological ROS  negative psych ROS   GI/Hepatic negative GI ROS, Neg liver ROS, GERD  Medicated and Controlled,  Endo/Other  negative endocrine ROS  Renal/GU negative Renal ROS  negative genitourinary   Musculoskeletal negative musculoskeletal ROS (+)   Abdominal   Peds  Hematology negative hematology ROS (+) anemia ,   Anesthesia Other Findings 1 chipped tooth on back on left  Reproductive/Obstetrics negative OB ROS                            Anesthesia Physical Anesthesia Plan  ASA: II  Anesthesia Plan: General   Post-op Pain Management:    Induction: Intravenous  PONV Risk Score and Plan: 4 or greater and 3 and Dexamethasone, Ondansetron and Treatment may vary due to age or medical condition  Airway Management Planned: Oral ETT  Additional Equipment:   Intra-op Plan:   Post-operative Plan: Extubation in OR  Informed Consent: I have reviewed the patients History and Physical, chart, labs and discussed the procedure including the risks, benefits and alternatives for the proposed anesthesia with the patient or authorized representative who has indicated his/her understanding and acceptance.   Dental advisory given and Dental Advisory Given  Plan Discussed with: CRNA  Anesthesia Plan Comments:         Anesthesia Quick Evaluation

## 2017-03-21 NOTE — Transfer of Care (Signed)
Immediate Anesthesia Transfer of Care Note  Patient: Loyal JacobsonCeloria Christo  Procedure(s) Performed: OPEN REDUCTION ZYGOMATIC FRACTURE TRANS ORAL APPROACH (Left )  Patient Location: PACU  Anesthesia Type:General  Level of Consciousness: awake, alert  and oriented  Airway & Oxygen Therapy: Patient Spontanous Breathing and Patient connected to nasal cannula oxygen  Post-op Assessment: Report given to RN, Post -op Vital signs reviewed and stable and Patient moving all extremities X 4  Post vital signs: Reviewed and stable  Last Vitals:  Vitals:   03/21/17 0950 03/21/17 1240  BP: (!) 148/94 (!) 157/107  Pulse: 62 90  Resp: 20 19  Temp:  (!) 36.1 C  SpO2: 100% 99%    Last Pain:  Vitals:   03/21/17 1240  TempSrc:   PainSc: 5       Patients Stated Pain Goal: 2 (03/21/17 0950)  Complications: No apparent anesthesia complications

## 2017-03-21 NOTE — Op Note (Signed)
DATE OF PROCEDURE:  03/21/2017    PRE-OPERATIVE DIAGNOSIS:  LEFT ZYGOMATIC ARCH FRACTURE    POST-OPERATIVE DIAGNOSIS:  Same    PROCEDURE(S): Open reduction of zygomatic arch fracture    SURGEON:  Misty StanleyAmanda Jo Marcellino, MD    ASSISTANT(S):  Flo ShanksKarol Wolicki, MD    ANESTHESIA:  General endotracheal anesthesia      ESTIMATED BLOOD LOSS:  5 mL   SPECIMENS:  none    COMPLICATIONS:  None    OPERATIVE FINDINGS:  The patient's depressed left isolated zygomatic arch fracture demonstrated a straightforward and adequate reduction.      OPERATIVE DETAILS: The patient was taken to the operating room and placed in supine position General anesthesia was induced. The patient was prepped and draped in the usual sterile fashion. A timeout was performed. The left gingiva was anesthetized with 1% lidocaine with 1:100,000 epinephrine and incised with Bovie. Blunt dissection was carried down to soft tissue with the Joker elevator. The zygoma fracture was palpated. Gentle pressure was applied and the fracture was reduced with optimal cosmesis. The incision was then irrigated and closed partially with 3-0 Vicryl interrupted sutures. All instrumentation was removed. The patient's nasopharynx was suctioned. The patient was returned to the care of the anesthesia staff, awakened, and transported to PACU in good condition.

## 2017-03-21 NOTE — Discharge Instructions (Signed)
-  Soft diet for 7 days -Rinse mouth with peridex mouth wash 3 times daily after meals.  -Brush teeth like normal -Ice to left face gently in 15 minute intervals. Place a towel between ice and skin.

## 2017-03-21 NOTE — H&P (Signed)
The surgical history remains accurate and without interval change. The condition still exists which makes the procedure necessary. The patient and/or family is aware of their condition and has been informed of the risks and benefits of surgery, as well as alternatives. All parties have elected to proceed with surgery.  Otolaryngology New Patient Note  Subjective: [name] is a 47 y.o. female kindly referred by Self, A Referral for evaluation of left facial fracture. Approximately 6 days ago she was walking and fell while moving with her mother. She hit her left cheek. There was no loss of consciousness. She was given a prescription for some pain medication as well as ibuprofen. She feels that the swelling has decreased however she continues to experience some difficulty with her bite. She feels that her left dentition demonstrates incomplete closure. There is associated left aural fullness. There is also some difficulty with complete mouth opening on the left. She has never had any facial fractures in the past. Non-smoker. No blood thinners. She is very interested in having this repaired as she notices the cosmetic deformity on the left side of her face.  Past Medical History:  Diagnosis Date  . Hypertension  . Seizures (HCC)   Past Surgical History:  Procedure Laterality Date  . APPENDECTOMY   Family History  Problem Relation Age of Onset  . Hypertension Father  . Hypertension Paternal Grandmother   Social History   Social History  . Marital status: N/A  Spouse name: N/A  . Number of children: N/A  . Years of education: N/A   Occupational History  . Not on file.   Social History Main Topics  . Smoking status: Never Smoker  . Smokeless tobacco: Never Used  . Alcohol use Yes  Comment: Social  . Drug use: Unknown  . Sexual activity: Not on file   Other Topics Concern  . Not on file   Social History Narrative  . No narrative on file   Allergies  Allergen  Reactions  . Penicillins Urticaria / Hives (ALLERGY) and Other (See Comments)  Has patient had a PCN reaction causing immediate rash, facial/tongue/throat swelling, SOB or lightheadedness with hypotension: No Has patient had a PCN reaction causing severe rash involving mucus membranes or skin necrosis: No Has patient had a PCN reaction that required hospitalization No Has patient had a PCN reaction occurring within the last 10 years: No If all of the above answers are "NO", then may proceed with Cephalosporin use.   Updated Medication List:   hydroCHLOROthiazide (HYDRODIURIL) 25 MG tablet  Sig - Route: Take 25 mg by mouth. - Oral  Class: Historical Med  ibuprofen (ADVIL,MOTRIN) 800 MG tablet  Sig - Route: Take 800 mg by mouth. - Oral  Class: Historical Med  lisinopril (PRINIVIL,ZESTRIL) 10 MG tablet  Sig - Route: Take 10 mg by mouth. - Oral  Class: Historical Med  potassium chloride ER (KLOR-CON-M, K-DUR) 20 MEQ extended release tablet  Sig - Route: Take by mouth. - Oral  Class: Historical Med    ROS A complete review of systems was conducted and was negative except as stated in the HPI.   Objective:  Vitals:   03/21/17 0852 03/21/17 0950  BP: (!) 157/112 (!) 148/94  Pulse: 75 62  Resp:  20  Temp: 98.2 F (36.8 C)   SpO2:  100%     Physical Exam:  General Normocephalic, Awake, Alert and appropriate for the exam  Eyes PERRL, no scleral icterus or conjunctival hemorrhage.  EOMI.  Ears Right ear- EAC patent, no obstructing cerumen. TM: intact, no effusion, no retraction, normal landmarks Left ear- EAC patent, no obstructing cerumen. TM: intact, no effusion, no retraction, normal landmarks  Nose Patent, No polyps or masses seen.  Oral Pharynx No mucosal lesions or tumors seen. Dentition is grossly normal with the exception of dental caries and fractured tooth which is old in the left maxillary posterior molar There is mild trismus There is an open bite on the left  dentition posteriorly. Otherwise there is class I occlusion  Lymphatics No cervical lymphadenopathy or masses on palpation  Endocrine No thyroidmegaly, no thyroid masses palpated  Cardio-vascular No cyanosis, regular rate  Pulmonary No audible stridor, Breathing easily with no labor. No dysphonia.  Neuro Symmetric facial movement.  Tongue protrudes in midline.  Psychiatry Appropriate affect and mood for clinic visit.  Skin No scars or lesions on face or neck.  There is an obvious depression over the left zygomatic arch. This is tender to the touch.   CT maxillofacial (personally reviewed)- significant depression in left zygomatic arch, no other facial fracture. Does not appear to involve the TMJ.     Assessment:  My impression is that Cecilee has  1. Closed fracture of left zygomatic arch, initial encounter (HCC)  . We discussed the risks and benefits of surgery today including pain, infection, bleeding, need for further surgery, scar, risks to facial nerve, persistent trismus, failure to achieve desired cosmetic result. The patient acknowledges these risks and agrees to proceed with surgery.   Plan:  1. ORIF of left zygomatic arch fracture at [facility]

## 2017-03-21 NOTE — Anesthesia Procedure Notes (Signed)
Procedure Name: Intubation Date/Time: 03/21/2017 12:02 PM Performed by: Neldon Newport, CRNA Pre-anesthesia Checklist: Timeout performed, Patient being monitored, Suction available, Emergency Drugs available and Patient identified Patient Re-evaluated:Patient Re-evaluated prior to induction Oxygen Delivery Method: Circle system utilized Preoxygenation: Pre-oxygenation with 100% oxygen Induction Type: IV induction Ventilation: Mask ventilation without difficulty Laryngoscope Size: Mac and 3 Grade View: Grade I Tube type: Oral Tube size: 7.0 mm Number of attempts: 1 Placement Confirmation: breath sounds checked- equal and bilateral,  positive ETCO2 and ETT inserted through vocal cords under direct vision Secured at: 22 cm Tube secured with: Tape Dental Injury: Teeth and Oropharynx as per pre-operative assessment

## 2017-03-24 ENCOUNTER — Encounter (HOSPITAL_COMMUNITY): Payer: Self-pay | Admitting: Otolaryngology

## 2017-03-24 NOTE — Anesthesia Postprocedure Evaluation (Signed)
Anesthesia Post Note  Patient: Anita JacobsonCeloria Perkins  Procedure(s) Performed: OPEN REDUCTION ZYGOMATIC FRACTURE TRANS ORAL APPROACH (Left )     Patient location during evaluation: PACU Anesthesia Type: General Level of consciousness: awake and alert Pain management: pain level controlled Vital Signs Assessment: post-procedure vital signs reviewed and stable Respiratory status: spontaneous breathing, nonlabored ventilation, respiratory function stable and patient connected to nasal cannula oxygen Cardiovascular status: blood pressure returned to baseline and stable Postop Assessment: no apparent nausea or vomiting Anesthetic complications: no    Last Vitals:  Vitals:   03/21/17 1311 03/21/17 1330  BP: (!) 170/98 (!) 171/103  Pulse: 74 77  Resp: 14 15  Temp:  (!) 36.3 C  SpO2: 100% 100%    Last Pain:  Vitals:   03/21/17 1311  TempSrc:   PainSc: 4                  Trevor IhaStephen A Gedalia Mcmillon

## 2017-11-08 IMAGING — CT CT HEAD W/O CM
3 of 4 series · 16 of 47 positions shown, 19 images · non-contrast
Comparison: None.

CLINICAL DATA: Blurry vision in the left thigh for 2 weeks.

EXAM:
CT HEAD WITHOUT CONTRAST
TECHNIQUE: Contiguous axial images were obtained from the base of the skull
through the vertex without intravenous contrast.

[Series 2: head w/o · axial · non-contrast · 0.45mm/px · z∈[-101,+24]mm · 10 of 31 slices shown, 13 images]
[im 3/31  brain]
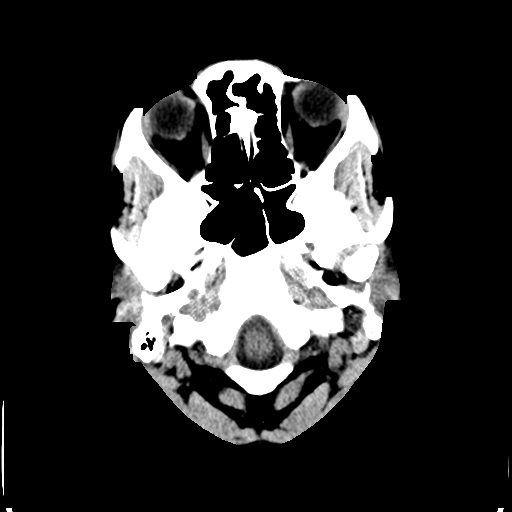
[im 3/31  bone]
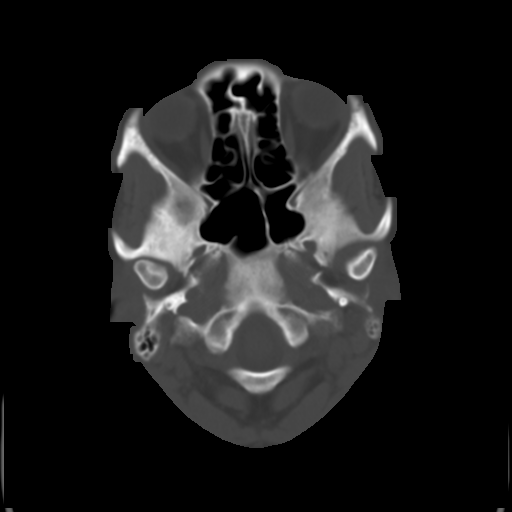
[im 5/31  brain]
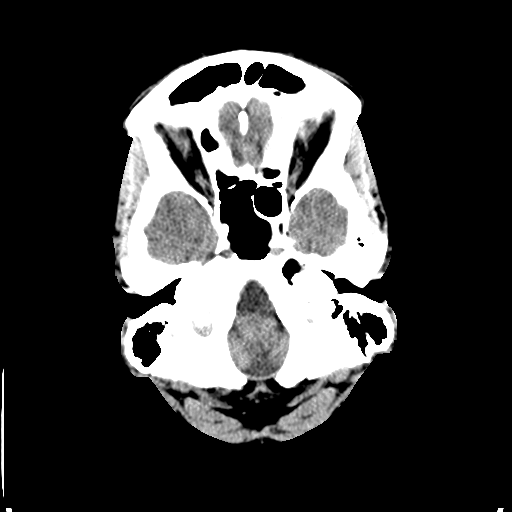
[im 9/31  brain]
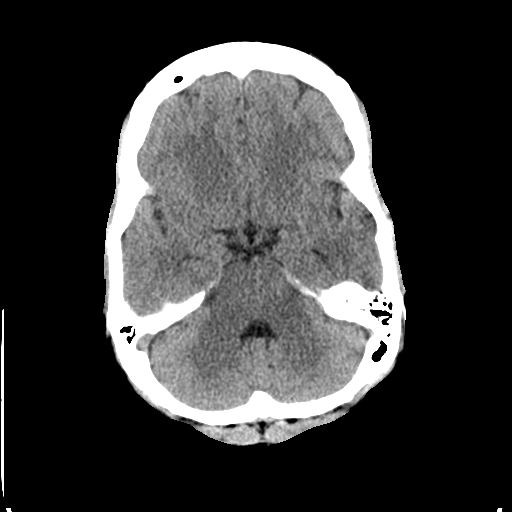
[im 11/31  brain]
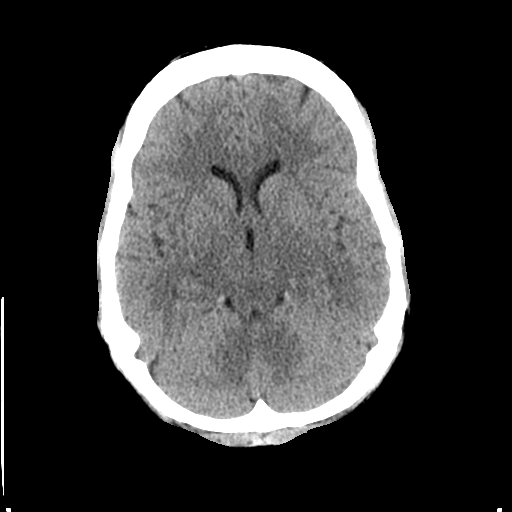
[im 13/31  brain]
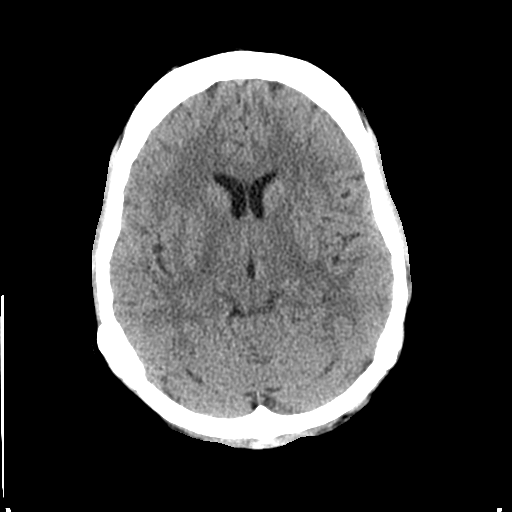
[im 13/31  bone]
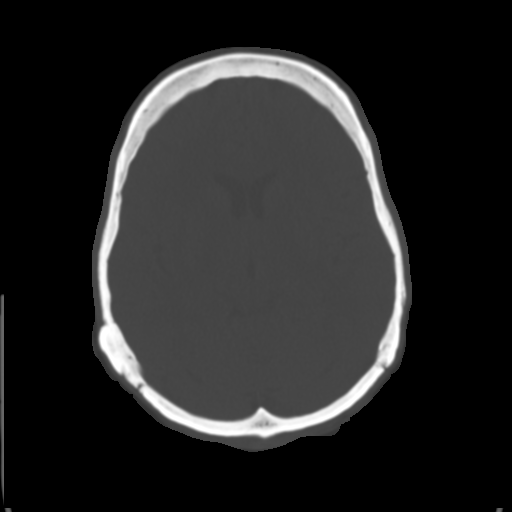
[im 18/31  brain]
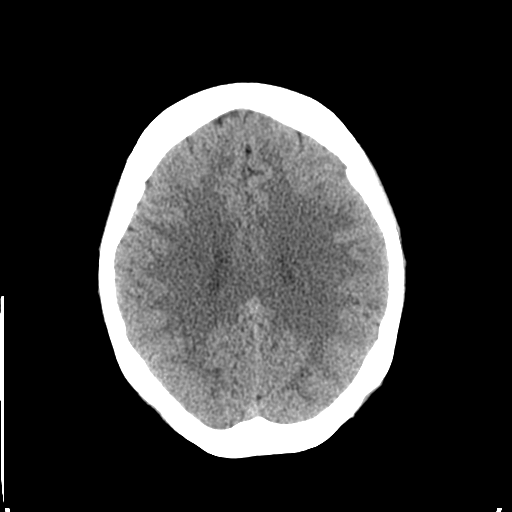
[im 20/31  brain]
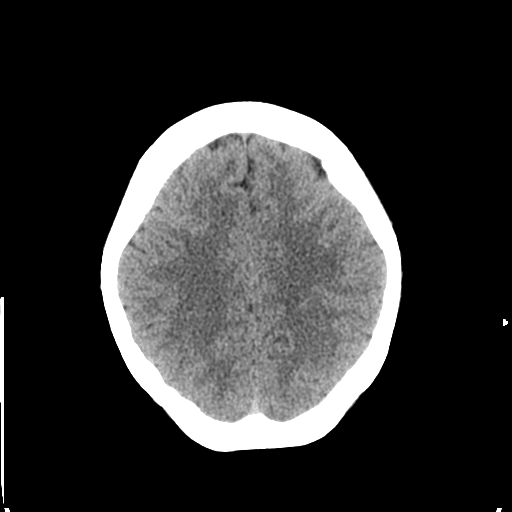
[im 22/31  brain]
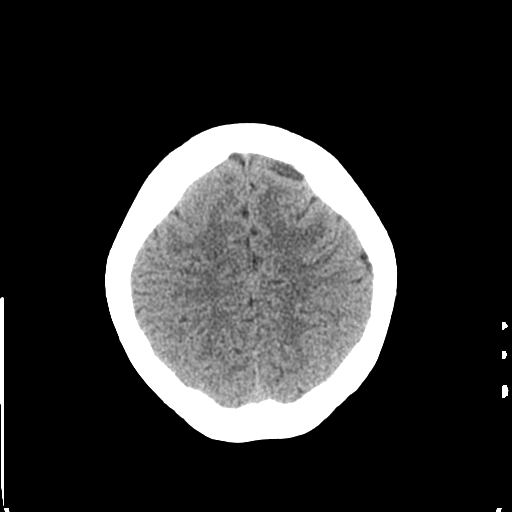
[im 26/31  brain]
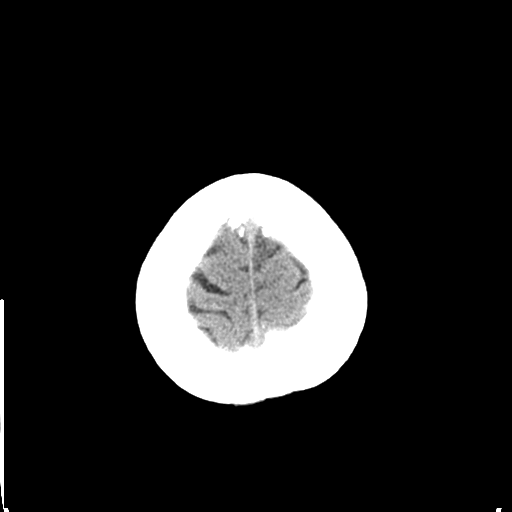
[im 26/31  bone]
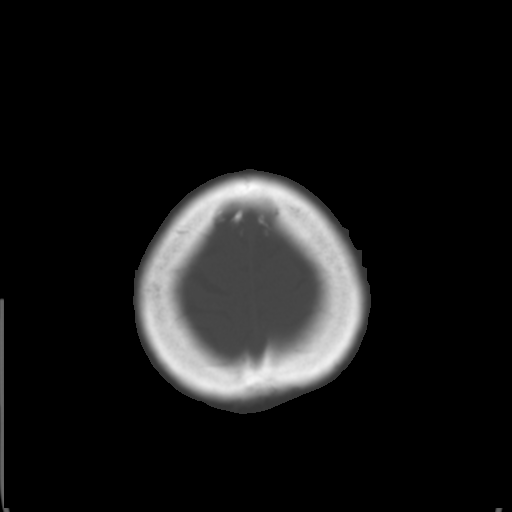
[im 28/31  brain]
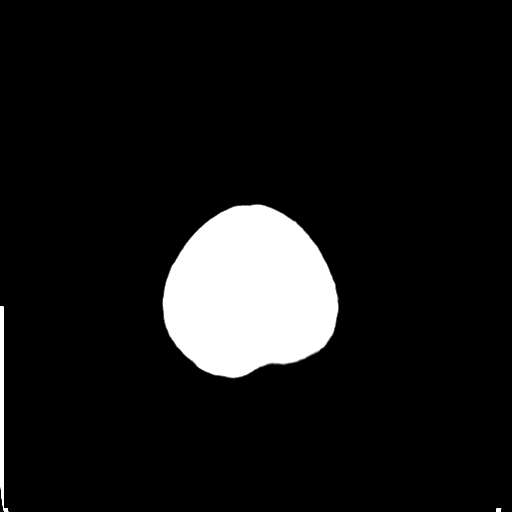

[Series 5: coronal · coronal · 0.30mm/px · 3 of 66 slices shown]
[im 22/66  brain]
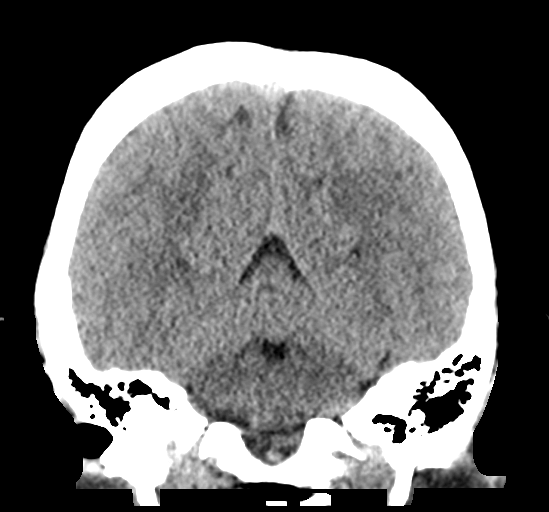
[im 29/66  brain]
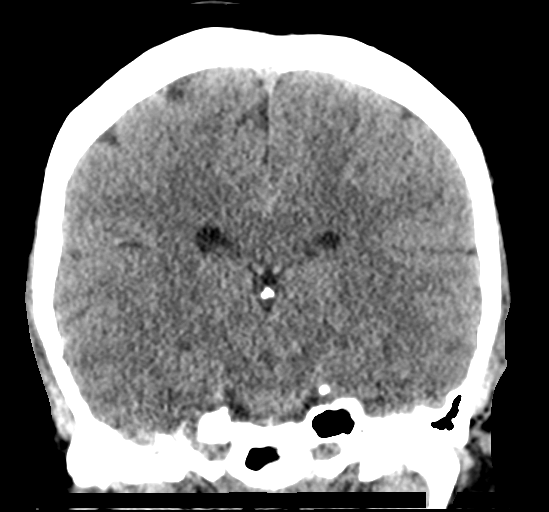
[im 37/66  brain]
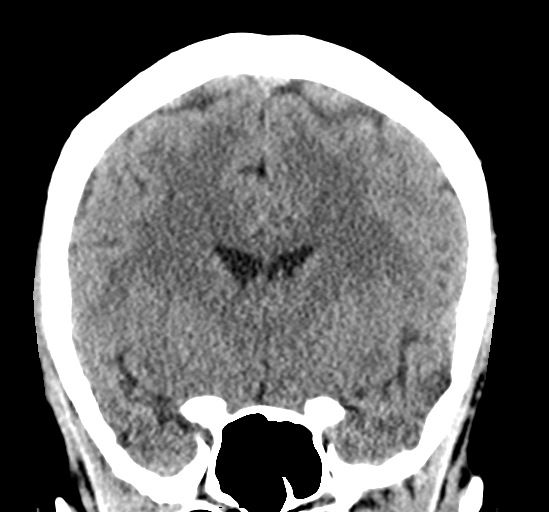

[Series 6: sagittal · sagittal · 0.29mm/px · 3 of 54 slices shown]
[im 18/54  brain]
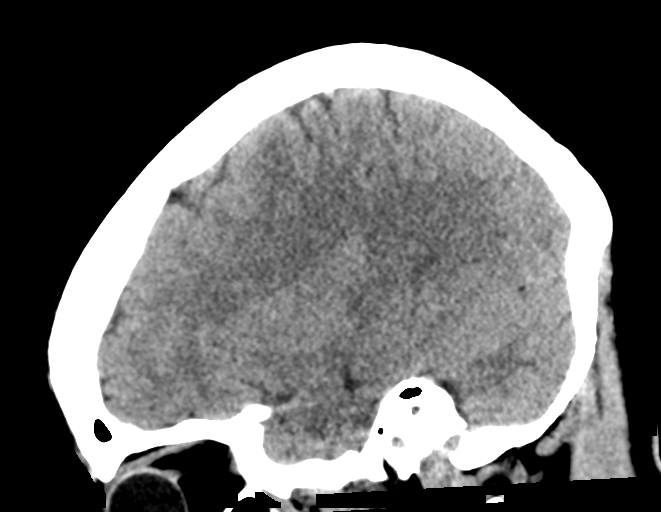
[im 27/54  brain]
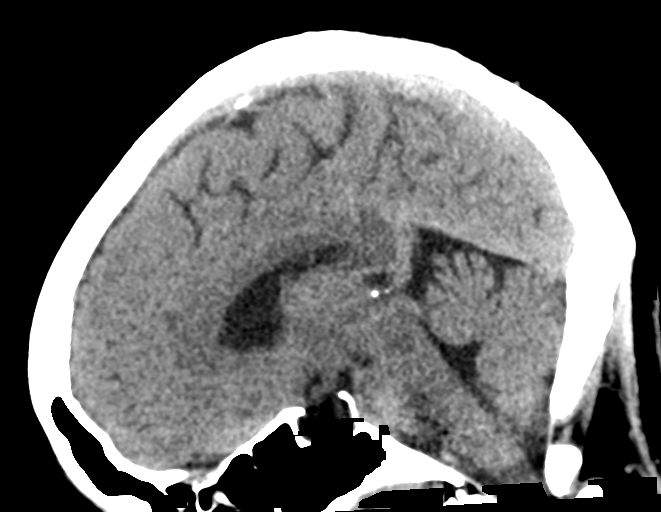
[im 36/54  brain]
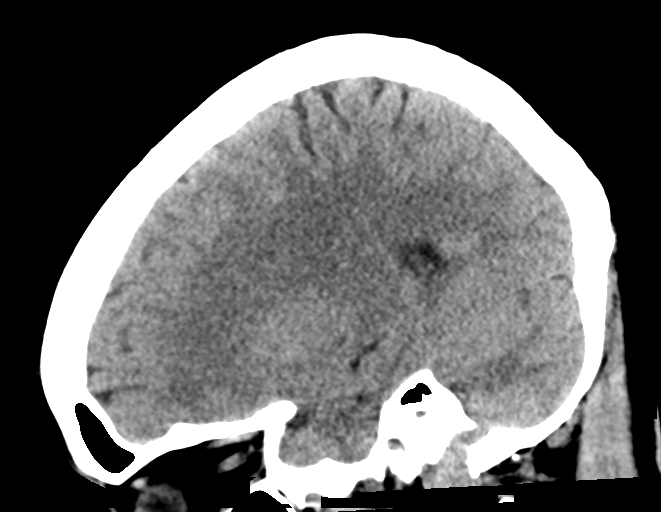

[16 of 47 positions shown; findings below may reference images not displayed]

FINDINGS: Brain: No evidence of acute infarction, hemorrhage, hydrocephalus,
extra-axial collection or mass lesion/mass effect.

Vascular: No hyperdense vessel or unexpected calcification.

Skull: No osseous abnormality.

Sinuses/Orbits: Visualized paranasal sinuses are clear. Visualized
mastoid sinuses are clear. Visualized orbits demonstrate no focal
abnormality.

Other: None
IMPRESSION: No acute intracranial pathology.

## 2018-06-20 ENCOUNTER — Emergency Department (HOSPITAL_COMMUNITY)
Admission: EM | Admit: 2018-06-20 | Discharge: 2018-06-20 | Disposition: A | Discharge status: Home / Self Care | Payer: Self-pay | Attending: Emergency Medicine | Admitting: Emergency Medicine

## 2018-06-20 ENCOUNTER — Other Ambulatory Visit: Payer: Self-pay

## 2018-06-20 ENCOUNTER — Encounter (HOSPITAL_COMMUNITY): Payer: Self-pay | Admitting: *Deleted

## 2018-06-20 DIAGNOSIS — W260XXA Contact with knife, initial encounter: Secondary | ICD-10-CM | POA: Insufficient documentation

## 2018-06-20 DIAGNOSIS — I10 Essential (primary) hypertension: Secondary | ICD-10-CM | POA: Insufficient documentation

## 2018-06-20 DIAGNOSIS — S61217A Laceration without foreign body of left little finger without damage to nail, initial encounter: Secondary | ICD-10-CM | POA: Insufficient documentation

## 2018-06-20 DIAGNOSIS — Y999 Unspecified external cause status: Secondary | ICD-10-CM | POA: Insufficient documentation

## 2018-06-20 DIAGNOSIS — Y929 Unspecified place or not applicable: Secondary | ICD-10-CM | POA: Insufficient documentation

## 2018-06-20 DIAGNOSIS — Y93G1 Activity, food preparation and clean up: Secondary | ICD-10-CM | POA: Insufficient documentation

## 2018-06-20 DIAGNOSIS — Z79899 Other long term (current) drug therapy: Secondary | ICD-10-CM | POA: Insufficient documentation

## 2018-06-20 MED ORDER — BACITRACIN ZINC 500 UNIT/GM EX OINT
TOPICAL_OINTMENT | Freq: Two times a day (BID) | CUTANEOUS | Status: DC
  Administered 2018-06-20: 1 via TOPICAL
  Filled 2018-06-20: qty 0.9

## 2018-06-20 MED ORDER — IBUPROFEN 600 MG PO TABS: 600.0000 mg | ORAL_TABLET | Freq: Four times a day (QID) | ORAL | 0 refills | Status: DC | PRN

## 2018-06-20 MED ORDER — LIDOCAINE HCL (PF) 1 % IJ SOLN
30.0000 mL | Freq: Once | INTRAMUSCULAR | Status: AC
  Administered 2018-06-20: 30 mL
  Filled 2018-06-20: qty 30

## 2018-06-20 MED ORDER — IBUPROFEN 400 MG PO TABS
600.0000 mg | ORAL_TABLET | Freq: Once | ORAL | Status: AC
  Administered 2018-06-20: 600 mg via ORAL
  Filled 2018-06-20: qty 1

## 2018-06-20 NOTE — ED Provider Notes (Signed)
[facility] EMERGENCY DEPARTMENT Provider Note   CSN: [id number] Arrival date & time: 06/20/18  2140    History   Chief Complaint Chief Complaint  Patient presents with  . Laceration    HPI Anita Perkins is a 48 y.o. female.     Patient to ED with laceration to left 5th finger sustained with a knife while cutting up some fruit. Tetanus status up-to-date. No other injury. Patient is right hand dominant.  The history is provided by the patient. No language interpreter was used.  Laceration    Past Medical History:  Diagnosis Date  . Alcohol abuse   . Arthritis    lower back  . Closed fracture of left zygomatic arch (HCC)   . Depression   . GERD (gastroesophageal reflux disease)   . H/O tubal ligation   . Headache    migraines  . Hx of appendectomy   . Hypertension   . Ovarian cyst rupture   . Seizures (HCC)    has been cleared since 1995    Patient Active Problem List   Diagnosis Date Noted  . Ruptured ovarian cyst 02/08/2012  . Acute blood loss anemia 02/08/2012  . Hypertension 02/08/2012    Past Surgical History:  Procedure Laterality Date  . APPENDECTOMY    . ORIF ZYGOMATIC FRACTURE Left 03/21/2017   Procedure: OPEN REDUCTION ZYGOMATIC FRACTURE TRANS ORAL APPROACH;  Surgeon: Graylin Shiver, MD;  Location: [facility] OR;  Service: ENT;  Laterality: Left;  . TUBAL LIGATION       OB History   No obstetric history on file.      Home Medications    Prior to Admission medications   Medication Sig Start Date End Date Taking? Authorizing Provider  Biotin w/ Vitamins C & E (HAIR/SKIN/NAILS PO) Take 1 tablet by mouth daily.    [provider]  Cholecalciferol (VITAMIN D3 PO) Take 1 capsule by mouth daily.    [provider]  Cyanocobalamin (VITAMIN B-12 PO) Take 1 tablet by mouth daily.    [provider]  hydrochlorothiazide (HYDRODIURIL) 25 MG tablet Take 1 tablet (25 mg total) by mouth daily. 05/16/16   Pricilla Loveless, MD  ibuprofen (ADVIL,MOTRIN) 800 MG tablet Take 1 tablet (800 mg total) by mouth 3 (three) times daily with meals. Patient taking differently: Take 800 mg by mouth 3 (three) times daily as needed for headache or moderate pain.  03/12/17   Caccavale, Sophia, PA-C  lisinopril (PRINIVIL,ZESTRIL) 10 MG tablet Take 1 tablet (10 mg total) by mouth daily. 05/16/16   Arby Barrette, MD  Multiple Vitamins-Minerals (CENTRUM SILVER ULTRA WOMENS PO) Take 1 tablet by mouth daily.    [provider]  potassium chloride SA (K-DUR,KLOR-CON) 20 MEQ tablet Take 1 tablet (20 mEq total) by mouth daily. Patient not taking: Reported on 03/18/2017 05/16/16   Arby Barrette, MD  POTASSIUM PO Take 1 tablet by mouth daily.    [provider]  VITAMIN E PO Take 1 capsule by mouth daily.    [provider]    Family History No family history on file.  Social History Social History   Tobacco Use  . Smoking status: Never Smoker  . Smokeless tobacco: Never Used  Substance Use Topics  . Alcohol use: Yes    Frequency: Never    Comment: rare  . Drug use: No    Types: Marijuana    Comment: former marijuana user     Allergies   Penicillins  Review of Systems Review of Systems  Musculoskeletal:       See HPI.  Skin: Positive for wound.  Neurological: Negative.  Negative for numbness.     Physical Exam Updated Vital Signs BP (!) 126/100 (BP Location: Right Arm)   Pulse 81   Temp 98.4 F (36.9 C) (Oral)   Resp 18   LMP 06/07/2018   SpO2 100%   Physical Exam Constitutional:      Appearance: She is well-developed.  Neck:     Musculoskeletal: Normal range of motion.  Pulmonary:     Effort: Pulmonary effort is normal.  Skin:    General: Skin is warm and dry.     Comments: Flap laceration to palmar left 5th finger overlying PIP joint. Neurovascularly intact. No tendon deficit.  Neurological:     Mental Status: She is alert and oriented to person, place, and time.       ED Treatments / Results  Labs (all labs ordered are listed, but only abnormal results are displayed) Labs Reviewed - No data to display  EKG None  Radiology No results found.  Procedures .Marland Kitchen.Laceration Repair Date/Time: 06/20/2018 10:17 PM Performed by: Elpidio AnisUpstill, Koben Daman, PA-C Authorized by: Elpidio AnisUpstill, Tajha Sammarco, PA-C   Consent:    Consent obtained:  Verbal   Consent given by:  Patient Laceration details:    Location:  Finger   Length (cm):  3 Repair type:    Repair type:  Simple Pre-procedure details:    Preparation:  Patient was prepped and draped in usual sterile fashion Exploration:    Hemostasis achieved with:  Direct pressure   Wound exploration: entire depth of wound probed and visualized     Wound extent: no foreign bodies/material noted     Contaminated: no   Treatment:    Area cleansed with:  Betadine and saline   Amount of cleaning:  Standard Skin repair:    Repair method:  Sutures   Suture size:  5-0   Suture material:  Prolene   Suture technique:  Simple interrupted   Number of sutures:  8 Approximation:    Approximation:  Close Post-procedure details:    Dressing:  Antibiotic ointment and non-adherent dressing   Patient tolerance of procedure:  Tolerated well, no immediate complications   (including critical care time)  Medications Ordered in ED Medications  lidocaine (PF) (XYLOCAINE) 1 % injection 30 mL (has no administration in time range)     Initial Impression / Assessment and Plan / ED Course  I have reviewed the triage vital signs and the nursing notes.  Pertinent labs & imaging results that were available during my care of the patient were reviewed by me and considered in my medical decision making (see chart for details).        Patient to ED with simple laceration of left 5th finger, repaired as per above note.   Final Clinical Impressions(s) / ED Diagnoses   Final diagnoses:  None   1. Finger laceration  ED Discharge Orders     None       Danne HarborUpstill, Ifrah Vest, PA-C 06/20/18 2339    Arby BarrettePfeiffer, Marcy, MD 06/21/18 [phone]

## 2018-06-20 NOTE — ED Triage Notes (Signed)
Pt cut her left hand pinky finger with a knife while cutting an orange. Last tetanus about 6 months ago.

## 2018-06-20 NOTE — ED Notes (Signed)
Patient verbalizes understanding of discharge instructions. Opportunity for questioning and answers were provided. Armband removed by staff, pt discharged from ED.  

## 2019-06-29 ENCOUNTER — Encounter (HOSPITAL_COMMUNITY): Payer: Self-pay | Admitting: Emergency Medicine

## 2019-06-29 ENCOUNTER — Emergency Department (HOSPITAL_COMMUNITY)
Admission: EM | Admit: 2019-06-29 | Discharge: 2019-06-30 | Disposition: A | Discharge status: Home / Self Care | Payer: Self-pay | Attending: Emergency Medicine | Admitting: Emergency Medicine

## 2019-06-29 ENCOUNTER — Other Ambulatory Visit: Payer: Self-pay

## 2019-06-29 DIAGNOSIS — Y999 Unspecified external cause status: Secondary | ICD-10-CM | POA: Insufficient documentation

## 2019-06-29 DIAGNOSIS — Y929 Unspecified place or not applicable: Secondary | ICD-10-CM | POA: Insufficient documentation

## 2019-06-29 DIAGNOSIS — Z79899 Other long term (current) drug therapy: Secondary | ICD-10-CM | POA: Insufficient documentation

## 2019-06-29 DIAGNOSIS — S0181XA Laceration without foreign body of other part of head, initial encounter: Secondary | ICD-10-CM | POA: Insufficient documentation

## 2019-06-29 DIAGNOSIS — Y9351 Activity, roller skating (inline) and skateboarding: Secondary | ICD-10-CM | POA: Insufficient documentation

## 2019-06-29 DIAGNOSIS — I10 Essential (primary) hypertension: Secondary | ICD-10-CM | POA: Insufficient documentation

## 2019-06-29 MED ORDER — ACETAMINOPHEN 325 MG PO TABS
650.0000 mg | ORAL_TABLET | Freq: Once | ORAL | Status: AC
  Administered 2019-06-30: 650 mg via ORAL
  Filled 2019-06-29: qty 2

## 2019-06-29 MED ORDER — LIDOCAINE-EPINEPHRINE (PF) 2 %-1:200000 IJ SOLN
10.0000 mL | Freq: Once | INTRAMUSCULAR | Status: AC
  Administered 2019-06-30: 10 mL
  Filled 2019-06-29: qty 20

## 2019-06-29 NOTE — ED Provider Notes (Signed)
[facility]-EMERGENCY DEPT Provider Note   CSN: [id number] Arrival date & time: 06/29/19  2217     History Chief Complaint  Patient presents with  . Laceration    Anita Perkins is a 49 y.o. female with past medical history significant for alcohol abuse, arthritis, hypertension presents to emergency department today with chief complaint of laceration above right eye happened 2 hours prior to arrival.  Patient states she was roller blading when she lost her balance and fell forward.  She states she hit the right side of her face on the pavement.  She was not wearing a helmet.  She has abrasions to her left knee and right elbow.  She denies any loss of consciousness.  Patient states she was able to get up without assistance and has been ambulatory since the fall.  She cleaned the wounds with soap and water at home prior to arrival.  She did not take any medications for symptoms prior to arrival. She denies headache, neck pain, visual changes, back pain, numbness or tingling in extremities.  Patient knows her tetanus immunization is up-to-date.    Past Medical History:  Diagnosis Date  . Alcohol abuse   . Arthritis    lower back  . Closed fracture of left zygomatic arch (HCC)   . Depression   . GERD (gastroesophageal reflux disease)   . H/O tubal ligation   . Headache    migraines  . Hx of appendectomy   . Hypertension   . Ovarian cyst rupture   . Seizures (HCC)    has been cleared since 1995    Patient Active Problem List   Diagnosis Date Noted  . Ruptured ovarian cyst 02/08/2012  . Acute blood loss anemia 02/08/2012  . Hypertension 02/08/2012    Past Surgical History:  Procedure Laterality Date  . APPENDECTOMY    . ORIF ZYGOMATIC FRACTURE Left 03/21/2017   Procedure: OPEN REDUCTION ZYGOMATIC FRACTURE TRANS ORAL APPROACH;  Surgeon: Graylin Shiver, MD;  Location: [facility] OR;  Service: ENT;  Laterality: Left;  . TUBAL LIGATION       OB History   No  obstetric history on file.     No family history on file.  Social History   Tobacco Use  . Smoking status: Never Smoker  . Smokeless tobacco: Never Used  Substance Use Topics  . Alcohol use: Yes    Comment: rare  . Drug use: No    Types: Marijuana    Comment: former marijuana user    Home Medications Prior to Admission medications   Medication Sig Start Date End Date Taking? Authorizing Provider  hydrochlorothiazide (HYDRODIURIL) 25 MG tablet Take 1 tablet (25 mg total) by mouth daily. 05/16/16   Pricilla Loveless, MD  ibuprofen (ADVIL) 600 MG tablet Take 1 tablet (600 mg total) by mouth every 6 (six) hours as needed. 06/20/18   Elpidio Anis, PA-C  lisinopril (PRINIVIL,ZESTRIL) 10 MG tablet Take 1 tablet (10 mg total) by mouth daily. 05/16/16   Arby Barrette, MD  potassium chloride SA (K-DUR,KLOR-CON) 20 MEQ tablet Take 1 tablet (20 mEq total) by mouth daily. Patient not taking: Reported on 03/18/2017 05/16/16   Arby Barrette, MD    Allergies    Penicillins  Review of Systems   Review of Systems  All other systems are reviewed and are negative for acute change except as noted in the HPI.   Physical Exam Updated Vital Signs BP (!) 170/114 (BP Location: Right Arm)   Pulse Marland Kitchen)  115   Temp 98.2 F (36.8 C) (Oral)   SpO2 99%   Physical Exam Vitals and nursing note reviewed.  Constitutional:      Appearance: She is well-developed. She is not ill-appearing or toxic-appearing.  HENT:     Head: Normocephalic.     Jaw: There is normal jaw occlusion.      Comments: 2 cm laceration above right eyebrow as depicted in image above.  Wound is gaping.  There is no active bleeding.     Right Ear: No hemotympanum.     Left Ear: No hemotympanum.     Nose: Nose normal.     Right Nostril: No epistaxis or septal hematoma.     Left Nostril: No epistaxis or septal hematoma.     Mouth/Throat:     Lips: Pink.     Mouth: Mucous membranes are moist.     Dentition: No dental tenderness.    Eyes:     General: No scleral icterus.       Right eye: No discharge.        Left eye: No discharge.     Conjunctiva/sclera: Conjunctivae normal.  Neck:     Vascular: No JVD.     Comments: Full ROM intact without spinous process TTP. No bony stepoffs or deformities, no paraspinous muscle TTP or muscle spasms. No rigidity or meningeal signs. No bruising, erythema, or swelling.  Cardiovascular:     Rate and Rhythm: Normal rate and regular rhythm.     Pulses: Normal pulses.     Heart sounds: Normal heart sounds.  Pulmonary:     Effort: Pulmonary effort is normal.     Breath sounds: Normal breath sounds.  Abdominal:     General: There is no distension.  Musculoskeletal:        General: Normal range of motion.     Cervical back: Normal range of motion.     Comments: Full of motion of all extremities.  Moving extremities without any signs of injury.  No cervical, thoracic, or lumbar spinal tenderness to palpation. No paraspinal tenderness. No step offs, crepitus or deformity palpated.   Skin:    General: Skin is warm and dry.          Comments: Superficial abrasions to left knee and right elbow as depicted in image above.  Abrasions are approximately 2 x 2 cm in size.  There is no active bleeding.  Neurological:     Mental Status: She is oriented to person, place, and time.     GCS: GCS eye subscore is 4. GCS verbal subscore is 5. GCS motor subscore is 6.     Comments: Fluent speech, no facial droop.  Speech is clear and goal oriented, follows commands CN III-XII intact, no facial droop Normal strength in upper and lower extremities bilaterally including dorsiflexion and plantar flexion, strong and equal grip strength Sensation normal to light and sharp touch Moves extremities without ataxia, coordination intact Normal finger to nose and rapid alternating movements Normal gait and balance   Psychiatric:        Behavior: Behavior normal.        ED Results / Procedures /  Treatments   Labs (all labs ordered are listed, but only abnormal results are displayed) Labs Reviewed - No data to display  EKG None  Radiology No results found.  Procedures .Marland KitchenLaceration Repair  Date/Time: 06/30/2019 12:50 AM Performed by: Cherre Robins, PA-C Authorized by: Cherre Robins, PA-C   Consent:  Consent obtained:  Verbal   Consent given by:  Patient   Risks discussed:  Infection, pain, poor cosmetic result and poor wound healing   Alternatives discussed:  No treatment Anesthesia (see MAR for exact dosages):    Anesthesia method:  Local infiltration   Local anesthetic:  Lidocaine 2% WITH epi Laceration details:    Location:  Face   Face location:  Forehead   Length (cm):  1 Repair type:    Repair type:  Simple Pre-procedure details:    Preparation:  Patient was prepped and draped in usual sterile fashion and imaging obtained to evaluate for foreign bodies Exploration:    Hemostasis achieved with:  Direct pressure and epinephrine   Wound exploration: wound explored through full range of motion and entire depth of wound probed and visualized     Wound extent: no foreign bodies/material noted   Treatment:    Area cleansed with:  Saline   Amount of cleaning:  Standard   Irrigation solution:  Sterile water   Irrigation volume:  500 ml   Irrigation method:  Syringe Skin repair:    Repair method:  Sutures   Suture size:  6-0   Suture material:  Prolene   Suture technique:  Simple interrupted   Number of sutures:  2 Approximation:    Approximation:  Close Post-procedure details:    Dressing:  Open (no dressing)   Patient tolerance of procedure:  Tolerated well, no immediate complications   (including critical care time)  Medications Ordered in ED Medications  lidocaine-EPINEPHrine (XYLOCAINE W/EPI) 2 %-1:200000 (PF) injection 10 mL (has no administration in time range)  acetaminophen (TYLENOL) tablet 650 mg (has no administration in time  range)    ED Course  I have reviewed the triage vital signs and the nursing notes.  Pertinent labs & imaging results that were available during my care of the patient were reviewed by me and considered in my medical decision making (see chart for details).    MDM Rules/Calculators/A&P                       Patient presents to the emergency department with laceration to right eye which occurred within 2 hours PTA. Patient nontoxic appearing, resting comfortably.  Patient was noted to be tachycardic to 115 in triage.  When rechecked on my exam tachycardia had resolved.  Patient has no signs of serious head or neck injuries on exam.  Engaged in shared decision making and she does not wish to proceed with CT scan of her head.  I feel this is reasonable.  She is alert and oriented x4 with a normal neuro exam. Pressure irrigation performed. Wound explored and base of wound visualized in a bloodless field without evidence of foreign body. Laceration repair per procedure note above, tolerated well. Tetanus is up to date. Do not feel that abx are indicated at this time based on wound appearance and lack of significant comorbidities. Discussed suture home care as well as need for wound recheck and suture removal in 3-5 days.  I discussed results, treatment plan, need for follow-up, and return precautions with the patient including signs of infection.  Discussed signs to look out for concussion that would warrant return to emergency department.  Provided opportunity for questions, patient confirmed understanding and is in agreement with plan.    Portions of this note were generated with Scientist, clinical (histocompatibility and immunogenetics). Dictation errors may occur despite best attempts at proofreading.   Final Clinical  Impression(s) / ED Diagnoses Final diagnoses:  Facial laceration, initial encounter    Rx / DC Orders ED Discharge Orders    None       Sherene Sires, PA-C 06/30/19 0055    Glynn Octave,  MD 06/30/19 [phone]

## 2019-06-29 NOTE — ED Triage Notes (Signed)
Patient presents with a small laceration above her right eye after falling while roller skating. Patient has no other complaints.

## 2019-06-30 NOTE — Discharge Instructions (Addendum)

## 2021-01-09 ENCOUNTER — Encounter (HOSPITAL_COMMUNITY): Payer: Self-pay | Admitting: Emergency Medicine

## 2021-01-09 ENCOUNTER — Emergency Department (HOSPITAL_COMMUNITY): Payer: Self-pay

## 2021-01-09 ENCOUNTER — Other Ambulatory Visit: Payer: Self-pay

## 2021-01-09 ENCOUNTER — Emergency Department (HOSPITAL_COMMUNITY)
Admission: EM | Admit: 2021-01-09 | Discharge: 2021-01-09 | Disposition: A | Discharge status: Home / Self Care | Payer: Self-pay | Attending: Emergency Medicine | Admitting: Emergency Medicine

## 2021-01-09 DIAGNOSIS — I10 Essential (primary) hypertension: Secondary | ICD-10-CM | POA: Insufficient documentation

## 2021-01-09 DIAGNOSIS — R200 Anesthesia of skin: Secondary | ICD-10-CM

## 2021-01-09 DIAGNOSIS — M792 Neuralgia and neuritis, unspecified: Secondary | ICD-10-CM

## 2021-01-09 DIAGNOSIS — Z79899 Other long term (current) drug therapy: Secondary | ICD-10-CM | POA: Insufficient documentation

## 2021-01-09 DIAGNOSIS — M79601 Pain in right arm: Secondary | ICD-10-CM | POA: Insufficient documentation

## 2021-01-09 DIAGNOSIS — R202 Paresthesia of skin: Secondary | ICD-10-CM | POA: Insufficient documentation

## 2021-01-09 DIAGNOSIS — M25511 Pain in right shoulder: Secondary | ICD-10-CM | POA: Insufficient documentation

## 2021-01-09 MED ORDER — CYCLOBENZAPRINE HCL 10 MG PO TABS: 10.0000 mg | ORAL_TABLET | Freq: Two times a day (BID) | ORAL | 0 refills | Status: DC | PRN

## 2021-01-09 MED ORDER — METHYLPREDNISOLONE 4 MG PO TBPK: ORAL_TABLET | ORAL | 0 refills | Status: DC

## 2021-01-09 MED ORDER — LIDOCAINE 5 % EX PTCH: 1.0000 | MEDICATED_PATCH | CUTANEOUS | 0 refills | Status: DC

## 2021-01-09 NOTE — ED Notes (Signed)
Pt called for vital signs, no response. Will attempt again at a later time.
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
## 2021-01-09 NOTE — Discharge Instructions (Signed)
Your history, exam, work-up today are consistent with a likely nerve related pain and tingling going from your right shoulder down towards her hand.  You did not have significant pain or tenderness in the neck and given your multiple recent shoulder injuries I suspect this is the location of your symptoms.  The x-ray did not show any bony injuries however I do suspect there is some soft tissue inflammation or even scarring contributing to your symptoms.  As we discussed, we feel its appropriate to try some symptomatic first with muscle relaxant, Lidoderm patches, and a steroid Dosepak to try and help with the inflammation and symptoms.  We also want her to follow-up with orthopedics for further management.  You may end up needing outpatient advanced imaging to further evaluate.  If any symptoms change or worsen acutely, please return to the nearest emergency department.

## 2021-01-09 NOTE — ED Notes (Signed)
Patient given discharge instructions, all questions answered. Patient in possession of all belongings, directed to the discharge area  

## 2021-01-09 NOTE — ED Triage Notes (Signed)
C/o numbness to R fingertips x 2-3 weeks.  Reports numbness radiates to R hand and up R arm to shoulder with R arm pain when she lays down since yesterday.  Also reports 2 episodes of dizziness over the last week.
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
## 2022-07-04 IMAGING — DX DG SHOULDER 2+V*R*
3 series · 3 of 3 positions shown · non-contrast
Comparison: Right clavicle series 01/29/2004.

CLINICAL DATA: 50-year-old female with right shoulder pain
radiating distally. No recent injury.

EXAM:
RIGHT SHOULDER - 2+ VIEW

[shoulder grashey]
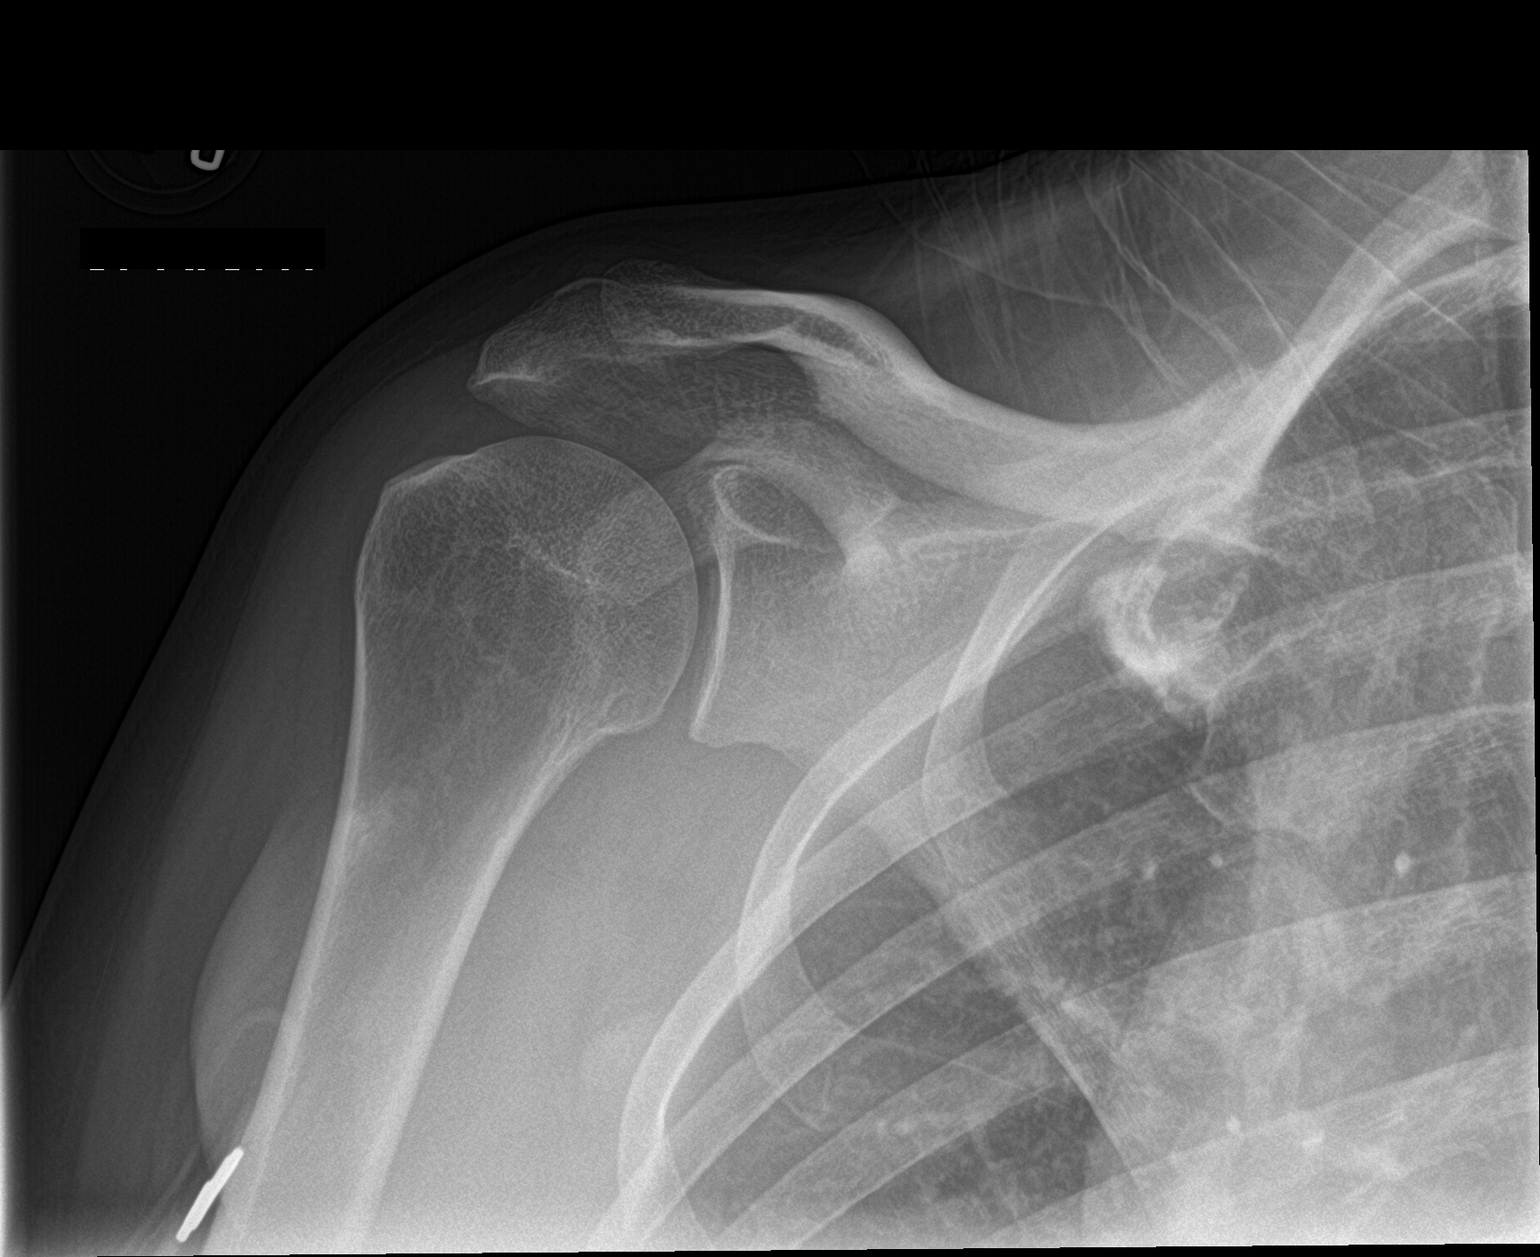

[shoulder y view]
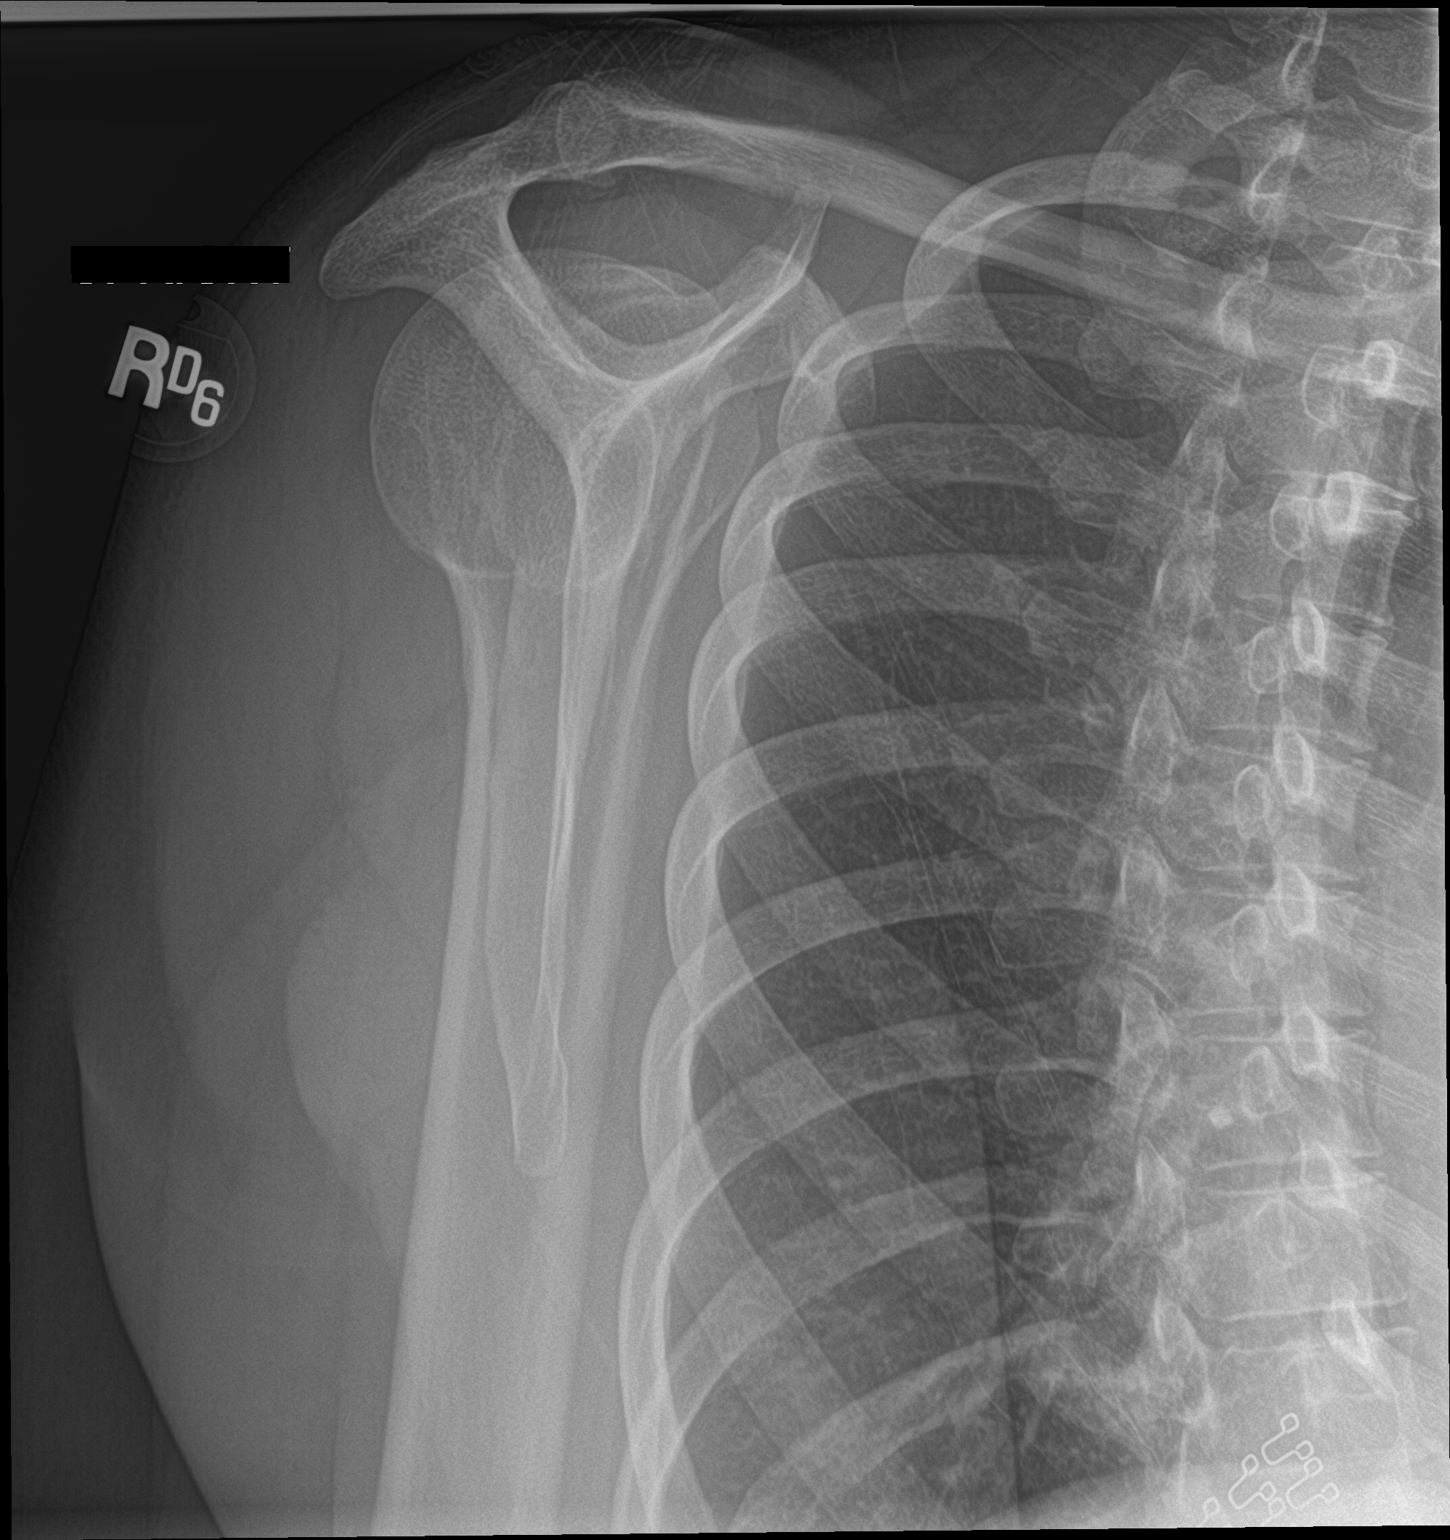

[shoulder axillary]
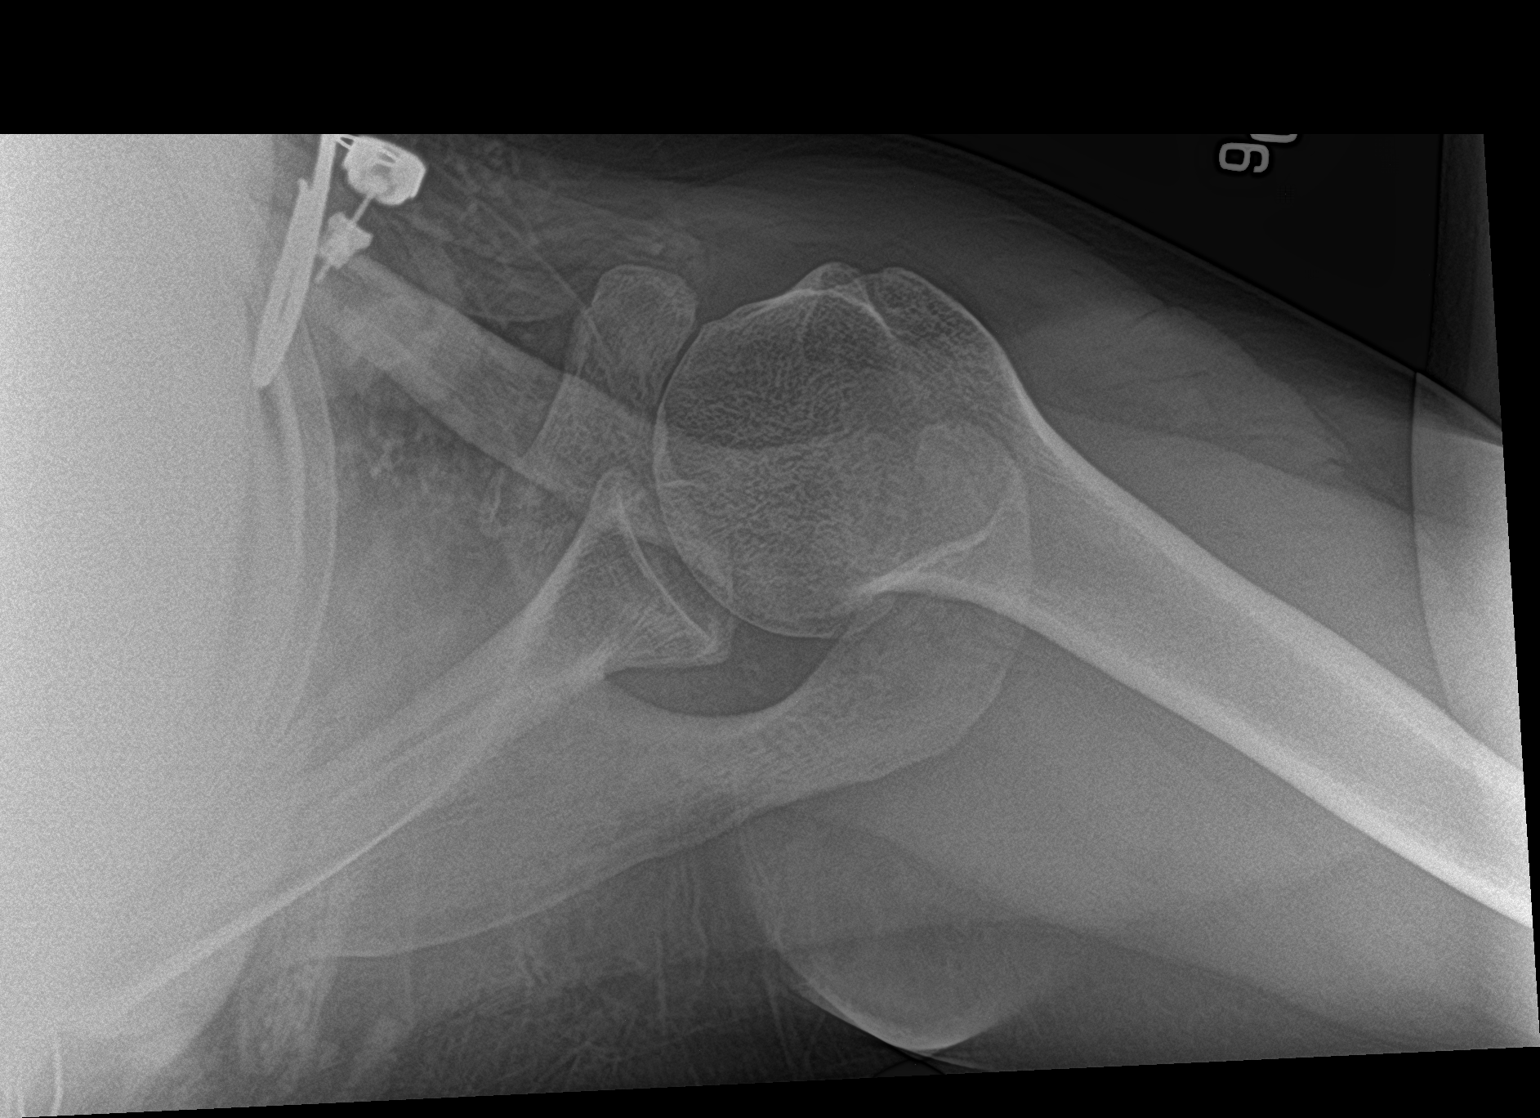

[3 of 3 positions shown; findings below may reference images not displayed]

FINDINGS: Bone mineralization is within normal limits. There is no evidence of
fracture or dislocation. There is no evidence of arthropathy or
other focal bone abnormality. Negative visible right ribs and chest.
IMPRESSION: Negative.

## 2023-02-20 ENCOUNTER — Encounter (HOSPITAL_COMMUNITY): Payer: Self-pay

## 2023-02-20 ENCOUNTER — Ambulatory Visit (HOSPITAL_COMMUNITY)
Admission: EM | Admit: 2023-02-20 | Discharge: 2023-02-20 | Disposition: A | Discharge status: Home / Self Care | Payer: Medicaid Other | Attending: Family Medicine | Admitting: Family Medicine

## 2023-02-20 DIAGNOSIS — L03211 Cellulitis of face: Secondary | ICD-10-CM | POA: Diagnosis not present

## 2023-02-20 MED ORDER — GENTAMICIN SULFATE 40 MG/ML IJ SOLN
240.0000 mg | Freq: Once | INTRAMUSCULAR | Status: AC
  Administered 2023-02-20: 240 mg via INTRAMUSCULAR

## 2023-02-20 MED ORDER — GENTAMICIN SULFATE 40 MG/ML IJ SOLN
INTRAMUSCULAR | Status: AC
  Filled 2023-02-20: qty 6

## 2023-02-20 MED ORDER — CLINDAMYCIN HCL 300 MG PO CAPS: 300.0000 mg | ORAL_CAPSULE | Freq: Three times a day (TID) | ORAL | 0 refills | Status: AC

## 2023-02-20 MED ORDER — KETOROLAC TROMETHAMINE 30 MG/ML IJ SOLN
INTRAMUSCULAR | Status: AC
  Filled 2023-02-20: qty 1

## 2023-02-20 MED ORDER — KETOROLAC TROMETHAMINE 30 MG/ML IJ SOLN
30.0000 mg | Freq: Once | INTRAMUSCULAR | Status: AC
  Administered 2023-02-20: 30 mg via INTRAMUSCULAR

## 2023-02-20 NOTE — Discharge Instructions (Addendum)
 You were seen today for cellulitis of your nose.   I have given you a shot of an antibiotic today and a shot of pain medication.  I have sent out an oral antibiotic to take as well.  I recommend warm compresses to the area if possible.  If you have worsening pain, redness, swelling, headache, nausea, vomiting or fever in the next 24 hrs then please go to the ER for further evaluation.

## 2023-02-20 NOTE — ED Provider Notes (Signed)
 [facility]    CSN: [id number] Arrival date & time: 02/20/23  1131      History   Chief Complaint Chief Complaint  Patient presents with   Facial Swelling    HPI Anita Perkins is a 53 y.o. female.   She is here for nose swelling.  Started as a small bump inside her nose, but could not really reach it.   It was there for a week, but the last 3 days having swelling to the nose, pain.  Goes up the nose, around the left eye.  Pain and swelling around the left eye as well.  No fevers/chills.  No n/v.        Past Medical History:  Diagnosis Date   Alcohol abuse    Arthritis    lower back   Closed fracture of left zygomatic arch (HCC)    Depression    GERD (gastroesophageal reflux disease)    H/O tubal ligation    Headache    migraines   Hx of appendectomy    Hypertension    Ovarian cyst rupture    Seizures (HCC)    has been cleared since 1995    Patient Active Problem List   Diagnosis Date Noted   Ruptured ovarian cyst 02/08/2012   Acute blood loss anemia 02/08/2012   Hypertension 02/08/2012    Past Surgical History:  Procedure Laterality Date   APPENDECTOMY     ORIF ZYGOMATIC FRACTURE Left 03/21/2017   Procedure: OPEN REDUCTION ZYGOMATIC FRACTURE TRANS ORAL APPROACH;  Surgeon: Terri Alan PARAS, MD;  Location: [facility] OR;  Service: ENT;  Laterality: Left;   TUBAL LIGATION      OB History   No obstetric history on file.      Home Medications    Prior to Admission medications   Medication Sig Start Date End Date Taking? Authorizing Provider  Ferrous Sulfate (IRON PO) Take by mouth. 03/28/17  Yes [provider]  ascorbic acid (VITAMIN C) 1000 MG tablet Take 1,000 mg by mouth daily.    [provider]  Cholecalciferol 125 MCG (5000 UT) TABS Take 2 tablets by mouth daily.    [provider]  Cyanocobalamin 1000 MCG TBCR Take 2 tablets by mouth daily.    [provider]  GARLIC PO Take 1 capsule by mouth daily.     [provider]  hydrochlorothiazide  (HYDRODIURIL ) 25 MG tablet Take 1 tablet (25 mg total) by mouth daily. 05/16/16   Freddi Hamilton, MD  losartan (COZAAR) 100 MG tablet Take 100 mg by mouth daily.    [provider]    Family History History reviewed. No pertinent family history.  Social History Social History   Tobacco Use   Smoking status: Never   Smokeless tobacco: Never  Vaping Use   Vaping status: Never Used  Substance Use Topics   Alcohol use: Yes    Comment: rare   Drug use: No    Types: Marijuana    Comment: former marijuana user     Allergies   Penicillins   Review of Systems Review of Systems  Constitutional: Negative.   HENT: Negative.    Respiratory: Negative.    Cardiovascular: Negative.   Gastrointestinal: Negative.   Genitourinary: Negative.   Skin:  Positive for color change.     Physical Exam Triage Vital Signs ED Triage Vitals  Encounter Vitals Group     BP 02/20/23 1232 (!) 149/93     Systolic BP Percentile --  Diastolic BP Percentile --      Pulse Rate 02/20/23 1232 73     Resp 02/20/23 1232 16     Temp 02/20/23 1232 98.3 F (36.8 C)     Temp Source 02/20/23 1232 Oral     SpO2 02/20/23 1232 99 %     Weight 02/20/23 1232 208 lb (94.3 kg)     Height 02/20/23 1232 5' 6.5 (1.689 m)     Head Circumference --      Peak Flow --      Pain Score 02/20/23 1231 8     Pain Loc --      Pain Education --      Exclude from Growth Chart --    No data found.  Updated Vital Signs BP (!) 149/93 (BP Location: Left Arm)   Pulse 73   Temp 98.3 F (36.8 C) (Oral)   Resp 16   Ht 5' 6.5 (1.689 m)   Wt 94.3 kg   LMP 01/27/2023 (Approximate)   SpO2 99%   BMI 33.07 kg/m   Visual Acuity Right Eye Distance:   Left Eye Distance:   Bilateral Distance:    Right Eye Near:   Left Eye Near:    Bilateral Near:     Physical Exam Constitutional:      Appearance: Normal appearance.  HENT:     Head:     Comments: There  is redness and swelling to the tip of the nose;  very tender;  she has TTP to the bridge of the nose and around the left eye;  no obvious swelling or redness to these areas;   Eyes:     General: Lids are normal.     Extraocular Movements: Extraocular movements intact.     Conjunctiva/sclera: Conjunctivae normal.  Cardiovascular:     Rate and Rhythm: Normal rate and regular rhythm.  Pulmonary:     Effort: Pulmonary effort is normal.     Breath sounds: Normal breath sounds.  Neurological:     General: No focal deficit present.     Mental Status: She is alert and oriented to person, place, and time.     Cranial Nerves: No cranial nerve deficit.     Motor: No weakness.  Psychiatric:        Mood and Affect: Mood normal.      UC Treatments / Results  Labs (all labs ordered are listed, but only abnormal results are displayed) Labs Reviewed - No data to display  EKG   Radiology No results found.  Procedures Procedures (including critical care time)  Medications Ordered in UC Medications  ketorolac  (TORADOL ) 30 MG/ML injection 30 mg (has no administration in time range)  gentamicin  (GARAMYCIN ) injection 240 mg (has no administration in time range)    Initial Impression / Assessment and Plan / UC Course  I have reviewed the triage vital signs and the nursing notes.  Pertinent labs & imaging results that were available during my care of the patient were reviewed by me and considered in my medical decision making (see chart for details).   Final Clinical Impressions(s) / UC Diagnoses   Final diagnoses:  Cellulitis of face     Discharge Instructions      You were seen today for cellulitis of your nose.   I have given you a shot of an antibiotic today and a shot of pain medication.  I have sent out an oral antibiotic to take as well.  I recommend warm compresses  to the area if possible.  If you have worsening pain, redness, swelling, headache, nausea, vomiting or fever  in the next 24 hrs then please go to the ER for further evaluation.     ED Prescriptions     Medication Sig Dispense Auth. Provider   clindamycin  (CLEOCIN ) 300 MG capsule Take 1 capsule (300 mg total) by mouth 3 (three) times daily for 7 days. 21 capsule Darral Longs, MD      PDMP not reviewed this encounter.   Darral Longs, MD 02/20/23 [phone]

## 2023-02-20 NOTE — ED Triage Notes (Signed)
 Patient here today with c/o nose swelling with redness, pain, and redness. Patient states that the pain is worsening.

## 2023-05-09 ENCOUNTER — Other Ambulatory Visit: Payer: Self-pay

## 2023-05-09 DIAGNOSIS — Z1231 Encounter for screening mammogram for malignant neoplasm of breast: Secondary | ICD-10-CM

## 2023-12-04 ENCOUNTER — Other Ambulatory Visit: Payer: Self-pay | Admitting: Family

## 2023-12-04 DIAGNOSIS — Z1231 Encounter for screening mammogram for malignant neoplasm of breast: Secondary | ICD-10-CM

## 2023-12-04 DIAGNOSIS — N926 Irregular menstruation, unspecified: Secondary | ICD-10-CM

## 2024-01-08 LAB — COLOGUARD: COLOGUARD: NEGATIVE

## 2024-02-18 ENCOUNTER — Ambulatory Visit

## 2024-03-29 ENCOUNTER — Ambulatory Visit
